# Patient Record
Sex: Male | Born: 1950 | Race: White | Hispanic: No | Marital: Married | Smoking: Never smoker
Health system: Southern US, Community
[De-identification: ages and names within clinical notes are randomized; demographics above are authoritative.]

## PROBLEM LIST (undated history)

## (undated) DIAGNOSIS — I10 Essential (primary) hypertension: Secondary | ICD-10-CM

## (undated) DIAGNOSIS — N200 Calculus of kidney: Secondary | ICD-10-CM

## (undated) DIAGNOSIS — M1990 Unspecified osteoarthritis, unspecified site: Secondary | ICD-10-CM

## (undated) DIAGNOSIS — Z973 Presence of spectacles and contact lenses: Secondary | ICD-10-CM

## (undated) DIAGNOSIS — F419 Anxiety disorder, unspecified: Secondary | ICD-10-CM

## (undated) HISTORY — PX: ROTATOR CUFF REPAIR: SHX139

## (undated) HISTORY — PX: WISDOM TOOTH EXTRACTION: SHX21

## (undated) HISTORY — PX: LITHOTRIPSY: SUR834

---

## 2001-06-26 ENCOUNTER — Encounter: Payer: Self-pay | Admitting: Internal Medicine

## 2001-06-26 ENCOUNTER — Ambulatory Visit (HOSPITAL_COMMUNITY): Admission: RE | Admit: 2001-06-26 | Discharge: 2001-06-26 | Payer: Self-pay | Admitting: Internal Medicine

## 2001-07-13 ENCOUNTER — Ambulatory Visit (HOSPITAL_COMMUNITY): Admission: RE | Admit: 2001-07-13 | Discharge: 2001-07-13 | Payer: Self-pay | Admitting: Orthopedic Surgery

## 2001-07-13 ENCOUNTER — Encounter: Payer: Self-pay | Admitting: Orthopedic Surgery

## 2001-08-20 ENCOUNTER — Ambulatory Visit (HOSPITAL_BASED_OUTPATIENT_CLINIC_OR_DEPARTMENT_OTHER): Admission: RE | Admit: 2001-08-20 | Discharge: 2001-08-20 | Payer: Self-pay | Admitting: Orthopedic Surgery

## 2001-08-22 ENCOUNTER — Encounter (HOSPITAL_COMMUNITY): Admission: RE | Admit: 2001-08-22 | Discharge: 2001-09-21 | Payer: Self-pay | Admitting: Orthopedic Surgery

## 2001-09-25 ENCOUNTER — Encounter (HOSPITAL_COMMUNITY): Admission: RE | Admit: 2001-09-25 | Discharge: 2001-10-25 | Payer: Self-pay | Admitting: *Deleted

## 2001-11-25 ENCOUNTER — Encounter: Payer: Self-pay | Admitting: *Deleted

## 2001-11-25 ENCOUNTER — Ambulatory Visit (HOSPITAL_COMMUNITY): Admission: RE | Admit: 2001-11-25 | Discharge: 2001-11-25 | Payer: Self-pay | Admitting: *Deleted

## 2005-11-14 ENCOUNTER — Emergency Department (HOSPITAL_COMMUNITY): Admission: EM | Admit: 2005-11-14 | Discharge: 2005-11-14 | Payer: Self-pay | Admitting: *Deleted

## 2005-11-16 ENCOUNTER — Ambulatory Visit (HOSPITAL_COMMUNITY): Admission: RE | Admit: 2005-11-16 | Discharge: 2005-11-16 | Payer: Self-pay | Admitting: Orthopaedic Surgery

## 2005-11-21 ENCOUNTER — Emergency Department (HOSPITAL_COMMUNITY): Admission: EM | Admit: 2005-11-21 | Discharge: 2005-11-21 | Payer: Self-pay | Admitting: *Deleted

## 2005-12-02 ENCOUNTER — Ambulatory Visit (HOSPITAL_COMMUNITY): Admission: RE | Admit: 2005-12-02 | Discharge: 2005-12-02 | Payer: Self-pay | Admitting: Orthopaedic Surgery

## 2015-09-08 ENCOUNTER — Other Ambulatory Visit: Payer: Self-pay | Admitting: Orthopaedic Surgery

## 2015-09-11 ENCOUNTER — Encounter (HOSPITAL_COMMUNITY)
Admission: RE | Admit: 2015-09-11 | Discharge: 2015-09-11 | Disposition: A | Discharge status: Home / Self Care | Payer: BLUE CROSS/BLUE SHIELD | Source: Ambulatory Visit | Attending: Orthopaedic Surgery | Admitting: Orthopaedic Surgery

## 2015-09-11 ENCOUNTER — Encounter (HOSPITAL_COMMUNITY): Payer: Self-pay

## 2015-09-11 DIAGNOSIS — Z0183 Encounter for blood typing: Secondary | ICD-10-CM | POA: Diagnosis not present

## 2015-09-11 DIAGNOSIS — M1611 Unilateral primary osteoarthritis, right hip: Secondary | ICD-10-CM | POA: Insufficient documentation

## 2015-09-11 DIAGNOSIS — I10 Essential (primary) hypertension: Secondary | ICD-10-CM | POA: Diagnosis not present

## 2015-09-11 DIAGNOSIS — Z01818 Encounter for other preprocedural examination: Secondary | ICD-10-CM | POA: Diagnosis not present

## 2015-09-11 DIAGNOSIS — Z01812 Encounter for preprocedural laboratory examination: Secondary | ICD-10-CM | POA: Insufficient documentation

## 2015-09-11 DIAGNOSIS — Z79899 Other long term (current) drug therapy: Secondary | ICD-10-CM | POA: Diagnosis not present

## 2015-09-11 HISTORY — DX: Presence of spectacles and contact lenses: Z97.3

## 2015-09-11 HISTORY — DX: Unspecified osteoarthritis, unspecified site: M19.90

## 2015-09-11 HISTORY — DX: Essential (primary) hypertension: I10

## 2015-09-11 HISTORY — DX: Calculus of kidney: N20.0

## 2015-09-11 HISTORY — DX: Anxiety disorder, unspecified: F41.9

## 2015-09-11 LAB — COMPREHENSIVE METABOLIC PANEL
ALT: 47 U/L (ref 17–63)
ANION GAP: 10 (ref 5–15)
AST: 35 U/L (ref 15–41)
Albumin: 4.3 g/dL (ref 3.5–5.0)
Alkaline Phosphatase: 80 U/L (ref 38–126)
BILIRUBIN TOTAL: 1.6 mg/dL — AB (ref 0.3–1.2)
BUN: 17 mg/dL (ref 6–20)
CALCIUM: 9.5 mg/dL (ref 8.9–10.3)
CO2: 26 mmol/L (ref 22–32)
Chloride: 100 mmol/L — ABNORMAL LOW (ref 101–111)
Creatinine, Ser: 0.87 mg/dL (ref 0.61–1.24)
GLUCOSE: 105 mg/dL — AB (ref 65–99)
Potassium: 3.8 mmol/L (ref 3.5–5.1)
Sodium: 136 mmol/L (ref 135–145)
TOTAL PROTEIN: 7.5 g/dL (ref 6.5–8.1)

## 2015-09-11 LAB — CBC WITH DIFFERENTIAL/PLATELET
Basophils Absolute: 0 10*3/uL (ref 0.0–0.1)
Basophils Relative: 0 %
EOS ABS: 0.1 10*3/uL (ref 0.0–0.7)
EOS PCT: 2 %
HCT: 41.1 % (ref 39.0–52.0)
Hemoglobin: 14.4 g/dL (ref 13.0–17.0)
LYMPHS ABS: 2.4 10*3/uL (ref 0.7–4.0)
LYMPHS PCT: 40 %
MCH: 31.7 pg (ref 26.0–34.0)
MCHC: 35 g/dL (ref 30.0–36.0)
MCV: 90.5 fL (ref 78.0–100.0)
MONO ABS: 0.5 10*3/uL (ref 0.1–1.0)
Monocytes Relative: 8 %
Neutro Abs: 3 10*3/uL (ref 1.7–7.7)
Neutrophils Relative %: 50 %
PLATELETS: 225 10*3/uL (ref 150–400)
RBC: 4.54 MIL/uL (ref 4.22–5.81)
RDW: 11.5 % (ref 11.5–15.5)
WBC: 6 10*3/uL (ref 4.0–10.5)

## 2015-09-11 LAB — ABO/RH: ABO/RH(D): B POS

## 2015-09-11 LAB — PROTIME-INR
INR: 1.04 (ref 0.00–1.49)
PROTHROMBIN TIME: 13.8 s (ref 11.6–15.2)

## 2015-09-11 LAB — TYPE AND SCREEN
ABO/RH(D): B POS
Antibody Screen: NEGATIVE

## 2015-09-11 LAB — URINALYSIS, ROUTINE W REFLEX MICROSCOPIC
Bilirubin Urine: NEGATIVE
Glucose, UA: NEGATIVE mg/dL
HGB URINE DIPSTICK: NEGATIVE
Ketones, ur: NEGATIVE mg/dL
Leukocytes, UA: NEGATIVE
Nitrite: NEGATIVE
PH: 6.5 (ref 5.0–8.0)
Protein, ur: NEGATIVE mg/dL
SPECIFIC GRAVITY, URINE: 1.018 (ref 1.005–1.030)

## 2015-09-11 LAB — SEDIMENTATION RATE: Sed Rate: 10 mm/hr (ref 0–16)

## 2015-09-11 LAB — SURGICAL PCR SCREEN
MRSA, PCR: NEGATIVE
STAPHYLOCOCCUS AUREUS: NEGATIVE

## 2015-09-11 LAB — C-REACTIVE PROTEIN: CRP: 1.3 mg/dL — AB (ref <1.0)

## 2015-09-11 LAB — APTT: APTT: 26 s (ref 24–37)

## 2015-09-11 NOTE — Progress Notes (Signed)
Pt denies SOB, chest pain, and being under the care of a cardiologist. Pt denies having an echo and cardiac cath but stated that a stress test was performed >10 years ago. Pt chart forwarded to anesthesia to review abnormal EKG.

## 2015-09-11 NOTE — Pre-Procedure Instructions (Signed)
    KEYONE SCHRAGE  09/11/2015      [id number] - [place], [place] - [address] AT [address] [place] [address] Phone: [phone] Fax: [phone]    Your procedure is scheduled on Thursday, September 17, 2015  Report to [facility] Admitting at 10:15 A.M.  Call this number if you have problems the morning of surgery:  [phone]   Remember:  Do not eat food or drink liquids after midnight Wednesday, September 16, 2015  Take these medicines the morning of surgery with A SIP OF WATER : amLODipine (NORVASC), carvedilol (COREG), sertraline (ZOLOFT)  Stop taking Aspirin, vitamins, fish oil and herbal medications such as Milk thistle. Do not take any NSAIDs ie: Ibuprofen, Advil, Naproxen, BC and Goody Powder or any medication containing Aspirin; stop now.  Do not wear jewelry, make-up or nail polish.  Do not wear lotions, powders, or perfumes.  You may not wear deodorant.  Do not shave 48 hours prior to surgery.  Men may shave face and neck.  Do not bring valuables to the hospital.  [facility] is not responsible for any belongings or valuables.  Contacts, dentures or bridgework may not be worn into surgery.  Leave your suitcase in the car.  After surgery it may be brought to your room.  For patients admitted to the hospital, discharge time will be determined by your treatment team.  Patients discharged the day of surgery will not be allowed to drive home.   Name and phone number of your driver:   Special instructions:   Please read over the following fact sheets that you were given. Pain Booklet, Coughing and Deep Breathing, Blood Transfusion Information, Total Joint Packet, MRSA Information and Surgical Site Infection Prevention

## 2015-09-14 NOTE — Progress Notes (Signed)
Anesthesia Chart Review:  Pt is a 65 year old male scheduled for R total hip arthroplasty anterior approach on 09/17/2015 with Dr. Erlinda Hong.   PMH includes:  HTN. Never smoker. BMI 27  Medications include: amlodipine, carvedilol, losartan-hctz.   Preoperative labs reviewed.    EKG 09/11/15: NSR. Possible LAE. Septal infarct, age undetermined  Reviewed case with Dr. Al Corpus.   If no changes, I anticipate pt can proceed with surgery as scheduled.   Willeen Cass, FNP-BC [facility] Short Stay [facility]/Anesthesiology Phone: [phone] 09/14/2015 4:11 PM

## 2015-09-16 MED ORDER — CEFAZOLIN SODIUM-DEXTROSE 2-3 GM-% IV SOLR
2.0000 g | INTRAVENOUS | Status: AC
  Administered 2015-09-17: 2 g via INTRAVENOUS
  Filled 2015-09-16: qty 50

## 2015-09-16 MED ORDER — BUPIVACAINE LIPOSOME 1.3 % IJ SUSP
20.0000 mL | INTRAMUSCULAR | Status: DC
  Filled 2015-09-16: qty 20

## 2015-09-16 MED ORDER — TRANEXAMIC ACID 1000 MG/10ML IV SOLN
1000.0000 mg | INTRAVENOUS | Status: AC
  Administered 2015-09-17: 1000 mg via INTRAVENOUS
  Filled 2015-09-16: qty 10

## 2015-09-17 ENCOUNTER — Encounter (HOSPITAL_COMMUNITY)
Admission: RE | Disposition: A | Discharge status: Home Health | Payer: Self-pay | Source: Ambulatory Visit | Attending: Orthopaedic Surgery

## 2015-09-17 ENCOUNTER — Inpatient Hospital Stay (HOSPITAL_COMMUNITY)
Admission: RE | Admit: 2015-09-17 | Discharge: 2015-09-19 | DRG: 470 | Disposition: A | Discharge status: Home Health | Payer: BLUE CROSS/BLUE SHIELD | Source: Ambulatory Visit | Attending: Orthopaedic Surgery | Admitting: Orthopaedic Surgery

## 2015-09-17 ENCOUNTER — Inpatient Hospital Stay (HOSPITAL_COMMUNITY): Payer: BLUE CROSS/BLUE SHIELD | Admitting: Emergency Medicine

## 2015-09-17 ENCOUNTER — Inpatient Hospital Stay (HOSPITAL_COMMUNITY): Payer: BLUE CROSS/BLUE SHIELD

## 2015-09-17 ENCOUNTER — Inpatient Hospital Stay (HOSPITAL_COMMUNITY): Payer: BLUE CROSS/BLUE SHIELD | Admitting: Anesthesiology

## 2015-09-17 ENCOUNTER — Encounter (HOSPITAL_COMMUNITY): Payer: Self-pay | Admitting: Surgery

## 2015-09-17 DIAGNOSIS — D62 Acute posthemorrhagic anemia: Secondary | ICD-10-CM | POA: Diagnosis not present

## 2015-09-17 DIAGNOSIS — M1990 Unspecified osteoarthritis, unspecified site: Secondary | ICD-10-CM

## 2015-09-17 DIAGNOSIS — F419 Anxiety disorder, unspecified: Secondary | ICD-10-CM | POA: Diagnosis present

## 2015-09-17 DIAGNOSIS — M1611 Unilateral primary osteoarthritis, right hip: Secondary | ICD-10-CM | POA: Diagnosis present

## 2015-09-17 DIAGNOSIS — Z96649 Presence of unspecified artificial hip joint: Secondary | ICD-10-CM

## 2015-09-17 DIAGNOSIS — I10 Essential (primary) hypertension: Secondary | ICD-10-CM | POA: Diagnosis present

## 2015-09-17 HISTORY — PX: TOTAL HIP ARTHROPLASTY: SHX124

## 2015-09-17 SURGERY — ARTHROPLASTY, HIP, TOTAL, ANTERIOR APPROACH
Anesthesia: Spinal | Site: Hip | Laterality: Right

## 2015-09-17 MED ORDER — CHLORHEXIDINE GLUCONATE 4 % EX LIQD: 60.0000 mL | Freq: Once | CUTANEOUS | Status: DC

## 2015-09-17 MED ORDER — DIPHENHYDRAMINE HCL 12.5 MG/5ML PO ELIX: 25.0000 mg | ORAL_SOLUTION | ORAL | Status: DC | PRN

## 2015-09-17 MED ORDER — BUPIVACAINE HCL (PF) 0.75 % IJ SOLN
INTRAMUSCULAR | Status: DC | PRN
  Administered 2015-09-17: 15 mg via INTRATHECAL

## 2015-09-17 MED ORDER — OXYCODONE HCL 5 MG PO TABS: 5.0000 mg | ORAL_TABLET | ORAL | Status: AC | PRN

## 2015-09-17 MED ORDER — OXYCODONE HCL 5 MG PO TABS
ORAL_TABLET | ORAL | Status: AC
  Filled 2015-09-17: qty 3

## 2015-09-17 MED ORDER — LACTATED RINGERS IV SOLN: INTRAVENOUS | Status: DC

## 2015-09-17 MED ORDER — MORPHINE SULFATE (PF) 2 MG/ML IV SOLN
1.0000 mg | INTRAVENOUS | Status: DC | PRN
Start: 2015-09-17 — End: 2015-09-19

## 2015-09-17 MED ORDER — ACETAMINOPHEN 500 MG PO TABS
1000.0000 mg | ORAL_TABLET | Freq: Four times a day (QID) | ORAL | Status: AC
  Administered 2015-09-18 (×3): 1000 mg via ORAL
  Filled 2015-09-17 (×3): qty 2

## 2015-09-17 MED ORDER — MIDAZOLAM HCL 2 MG/2ML IJ SOLN
INTRAMUSCULAR | Status: AC
  Filled 2015-09-17: qty 2

## 2015-09-17 MED ORDER — DEXAMETHASONE SODIUM PHOSPHATE 10 MG/ML IJ SOLN
10.0000 mg | Freq: Once | INTRAMUSCULAR | Status: AC
  Administered 2015-09-18: 10 mg via INTRAVENOUS
  Filled 2015-09-17: qty 1

## 2015-09-17 MED ORDER — OXYCODONE HCL ER 10 MG PO T12A: 10.0000 mg | EXTENDED_RELEASE_TABLET | Freq: Two times a day (BID) | ORAL | Status: AC

## 2015-09-17 MED ORDER — METHOCARBAMOL 500 MG PO TABS
ORAL_TABLET | ORAL | Status: AC
  Filled 2015-09-17: qty 1

## 2015-09-17 MED ORDER — SODIUM CHLORIDE 0.9 % IR SOLN
Status: DC | PRN
  Administered 2015-09-17: 2000 mL

## 2015-09-17 MED ORDER — POVIDONE-IODINE 10 % EX SOLN
CUTANEOUS | Status: DC | PRN
  Administered 2015-09-17: 1 via TOPICAL

## 2015-09-17 MED ORDER — LOSARTAN POTASSIUM-HCTZ 100-25 MG PO TABS: 1.0000 | ORAL_TABLET | Freq: Every day | ORAL | Status: DC

## 2015-09-17 MED ORDER — ACETAMINOPHEN 325 MG PO TABS: 650.0000 mg | ORAL_TABLET | Freq: Four times a day (QID) | ORAL | Status: DC | PRN

## 2015-09-17 MED ORDER — METOCLOPRAMIDE HCL 5 MG PO TABS: 5.0000 mg | ORAL_TABLET | Freq: Three times a day (TID) | ORAL | Status: DC | PRN

## 2015-09-17 MED ORDER — PROPOFOL 500 MG/50ML IV EMUL
INTRAVENOUS | Status: DC | PRN
  Administered 2015-09-17: 50 ug/kg/min via INTRAVENOUS

## 2015-09-17 MED ORDER — ASPIRIN EC 325 MG PO TBEC: 325.0000 mg | DELAYED_RELEASE_TABLET | Freq: Two times a day (BID) | ORAL | Status: AC

## 2015-09-17 MED ORDER — 0.9 % SODIUM CHLORIDE (POUR BTL) OPTIME
TOPICAL | Status: DC | PRN
  Administered 2015-09-17: 1000 mL

## 2015-09-17 MED ORDER — ASPIRIN EC 325 MG PO TBEC
325.0000 mg | DELAYED_RELEASE_TABLET | Freq: Two times a day (BID) | ORAL | Status: DC
  Administered 2015-09-17 – 2015-09-19 (×4): 325 mg via ORAL
  Filled 2015-09-17 (×4): qty 1

## 2015-09-17 MED ORDER — PHENOL 1.4 % MT LIQD
1.0000 | OROMUCOSAL | Status: DC | PRN
Start: 2015-09-17 — End: 2015-09-19

## 2015-09-17 MED ORDER — TRANEXAMIC ACID 1000 MG/10ML IV SOLN
1000.0000 mg | Freq: Once | INTRAVENOUS | Status: AC
  Administered 2015-09-17: 1000 mg via INTRAVENOUS
  Filled 2015-09-17: qty 10

## 2015-09-17 MED ORDER — LOSARTAN POTASSIUM 50 MG PO TABS
100.0000 mg | ORAL_TABLET | Freq: Every day | ORAL | Status: DC
  Administered 2015-09-17 – 2015-09-19 (×3): 100 mg via ORAL
  Filled 2015-09-17 (×4): qty 2

## 2015-09-17 MED ORDER — MENTHOL 3 MG MT LOZG: 1.0000 | LOZENGE | OROMUCOSAL | Status: DC | PRN

## 2015-09-17 MED ORDER — KETOROLAC TROMETHAMINE 30 MG/ML IJ SOLN
30.0000 mg | Freq: Four times a day (QID) | INTRAMUSCULAR | Status: DC | PRN
  Administered 2015-09-17: 30 mg via INTRAVENOUS

## 2015-09-17 MED ORDER — ONDANSETRON HCL 4 MG PO TABS: 4.0000 mg | ORAL_TABLET | Freq: Three times a day (TID) | ORAL | Status: AC | PRN

## 2015-09-17 MED ORDER — SERTRALINE HCL 50 MG PO TABS
50.0000 mg | ORAL_TABLET | Freq: Every day | ORAL | Status: DC
  Administered 2015-09-18 – 2015-09-19 (×2): 25 mg via ORAL
  Filled 2015-09-17 (×2): qty 1

## 2015-09-17 MED ORDER — LACTATED RINGERS IV SOLN
INTRAVENOUS | Status: DC
  Administered 2015-09-17 (×2): via INTRAVENOUS

## 2015-09-17 MED ORDER — MIDAZOLAM HCL 5 MG/5ML IJ SOLN
INTRAMUSCULAR | Status: DC | PRN
  Administered 2015-09-17 (×3): 1 mg via INTRAVENOUS

## 2015-09-17 MED ORDER — CARVEDILOL 6.25 MG PO TABS
6.2500 mg | ORAL_TABLET | Freq: Two times a day (BID) | ORAL | Status: DC
  Administered 2015-09-17 – 2015-09-19 (×4): 6.25 mg via ORAL
  Filled 2015-09-17 (×4): qty 1

## 2015-09-17 MED ORDER — HYDROCHLOROTHIAZIDE 25 MG PO TABS
25.0000 mg | ORAL_TABLET | Freq: Every day | ORAL | Status: DC
  Administered 2015-09-17 – 2015-09-19 (×3): 25 mg via ORAL
  Filled 2015-09-17 (×3): qty 1

## 2015-09-17 MED ORDER — OXYCODONE HCL 5 MG PO TABS
5.0000 mg | ORAL_TABLET | ORAL | Status: DC | PRN
  Administered 2015-09-17 (×2): 15 mg via ORAL
  Administered 2015-09-18: 10 mg via ORAL
  Administered 2015-09-18 (×2): 15 mg via ORAL
  Administered 2015-09-18: 10 mg via ORAL
  Filled 2015-09-17: qty 3
  Filled 2015-09-17: qty 2
  Filled 2015-09-17 (×3): qty 3
  Filled 2015-09-17: qty 2

## 2015-09-17 MED ORDER — ONDANSETRON HCL 4 MG/2ML IJ SOLN
INTRAMUSCULAR | Status: DC | PRN
  Administered 2015-09-17: 4 mg via INTRAVENOUS

## 2015-09-17 MED ORDER — SODIUM CHLORIDE 0.9 % IV SOLN
INTRAVENOUS | Status: DC
  Administered 2015-09-18: 07:00:00 via INTRAVENOUS

## 2015-09-17 MED ORDER — MAGNESIUM CITRATE PO SOLN: 1.0000 | Freq: Once | ORAL | Status: DC | PRN

## 2015-09-17 MED ORDER — FENTANYL CITRATE (PF) 250 MCG/5ML IJ SOLN
INTRAMUSCULAR | Status: AC
  Filled 2015-09-17: qty 5

## 2015-09-17 MED ORDER — ONDANSETRON HCL 4 MG/2ML IJ SOLN
4.0000 mg | Freq: Four times a day (QID) | INTRAMUSCULAR | Status: DC | PRN
  Administered 2015-09-18: 4 mg via INTRAVENOUS
  Filled 2015-09-17: qty 2

## 2015-09-17 MED ORDER — METHOCARBAMOL 1000 MG/10ML IJ SOLN
500.0000 mg | Freq: Four times a day (QID) | INTRAVENOUS | Status: DC | PRN
  Filled 2015-09-17: qty 5

## 2015-09-17 MED ORDER — SORBITOL 70 % SOLN: 30.0000 mL | Freq: Every day | Status: DC | PRN

## 2015-09-17 MED ORDER — METHOCARBAMOL 750 MG PO TABS: 750.0000 mg | ORAL_TABLET | Freq: Two times a day (BID) | ORAL | Status: DC | PRN

## 2015-09-17 MED ORDER — METOCLOPRAMIDE HCL 5 MG/ML IJ SOLN
5.0000 mg | Freq: Three times a day (TID) | INTRAMUSCULAR | Status: DC | PRN
  Administered 2015-09-18: 10 mg via INTRAVENOUS
  Filled 2015-09-17: qty 2

## 2015-09-17 MED ORDER — ACETAMINOPHEN 650 MG RE SUPP: 650.0000 mg | Freq: Four times a day (QID) | RECTAL | Status: DC | PRN

## 2015-09-17 MED ORDER — AMLODIPINE BESYLATE 5 MG PO TABS
5.0000 mg | ORAL_TABLET | Freq: Every day | ORAL | Status: DC
Start: 2015-09-17 — End: 2015-09-19
  Administered 2015-09-18 – 2015-09-19 (×2): 5 mg via ORAL
  Filled 2015-09-17 (×2): qty 1

## 2015-09-17 MED ORDER — HYDROMORPHONE HCL 1 MG/ML IJ SOLN: 0.2500 mg | INTRAMUSCULAR | Status: DC | PRN

## 2015-09-17 MED ORDER — METHOCARBAMOL 500 MG PO TABS
500.0000 mg | ORAL_TABLET | Freq: Four times a day (QID) | ORAL | Status: DC | PRN
  Administered 2015-09-17: 500 mg via ORAL

## 2015-09-17 MED ORDER — SENNOSIDES-DOCUSATE SODIUM 8.6-50 MG PO TABS: 1.0000 | ORAL_TABLET | Freq: Every evening | ORAL | Status: AC | PRN

## 2015-09-17 MED ORDER — FENTANYL CITRATE (PF) 100 MCG/2ML IJ SOLN
INTRAMUSCULAR | Status: DC | PRN
  Administered 2015-09-17 (×2): 50 ug via INTRAVENOUS

## 2015-09-17 MED ORDER — VITAMIN C 500 MG PO CHEW: 500.0000 mg | CHEWABLE_TABLET | Freq: Two times a day (BID) | ORAL | Status: AC

## 2015-09-17 MED ORDER — KETOROLAC TROMETHAMINE 30 MG/ML IJ SOLN
INTRAMUSCULAR | Status: AC
  Filled 2015-09-17: qty 1

## 2015-09-17 MED ORDER — PROPOFOL 10 MG/ML IV BOLUS
INTRAVENOUS | Status: AC
  Filled 2015-09-17: qty 20

## 2015-09-17 MED ORDER — OXYCODONE HCL ER 10 MG PO T12A
10.0000 mg | EXTENDED_RELEASE_TABLET | Freq: Two times a day (BID) | ORAL | Status: DC
  Administered 2015-09-17 – 2015-09-18 (×3): 10 mg via ORAL
  Filled 2015-09-17 (×4): qty 1

## 2015-09-17 MED ORDER — ALUM & MAG HYDROXIDE-SIMETH 200-200-20 MG/5ML PO SUSP: 30.0000 mL | ORAL | Status: DC | PRN

## 2015-09-17 MED ORDER — ONDANSETRON HCL 4 MG PO TABS: 4.0000 mg | ORAL_TABLET | Freq: Four times a day (QID) | ORAL | Status: DC | PRN

## 2015-09-17 MED ORDER — CEFAZOLIN SODIUM-DEXTROSE 2-3 GM-% IV SOLR
2.0000 g | Freq: Four times a day (QID) | INTRAVENOUS | Status: AC
  Administered 2015-09-17 – 2015-09-18 (×2): 2 g via INTRAVENOUS
  Filled 2015-09-17 (×2): qty 50

## 2015-09-17 MED ORDER — POLYETHYLENE GLYCOL 3350 17 G PO PACK
17.0000 g | PACK | Freq: Every day | ORAL | Status: DC | PRN
Start: 2015-09-17 — End: 2015-09-19

## 2015-09-17 SURGICAL SUPPLY — 59 items
APL SKNCLS STERI-STRIP NONHPOA (GAUZE/BANDAGES/DRESSINGS) ×1
BENZOIN TINCTURE PRP APPL 2/3 (GAUZE/BANDAGES/DRESSINGS) ×1 IMPLANT
BNDG COHESIVE 6X5 TAN STRL LF (GAUZE/BANDAGES/DRESSINGS) ×2 IMPLANT
CAPT HIP TOTAL 2 ×1 IMPLANT
CELLS DAT CNTRL 66122 CELL SVR (MISCELLANEOUS) ×1 IMPLANT
CLSR STERI-STRIP ANTIMIC 1/2X4 (GAUZE/BANDAGES/DRESSINGS) ×1 IMPLANT
COVER SURGICAL LIGHT HANDLE (MISCELLANEOUS) ×2 IMPLANT
DRAPE C-ARM 42X72 X-RAY (DRAPES) ×2 IMPLANT
DRAPE IMP U-DRAPE 54X76 (DRAPES) ×2 IMPLANT
DRAPE STERI IOBAN 125X83 (DRAPES) ×2 IMPLANT
DRAPE U-SHAPE 47X51 STRL (DRAPES) ×6 IMPLANT
DRSG AQUACEL AG ADV 3.5X10 (GAUZE/BANDAGES/DRESSINGS) ×2 IMPLANT
DRSG MEPILEX BORDER 4X8 (GAUZE/BANDAGES/DRESSINGS) IMPLANT
DURAPREP 26ML APPLICATOR (WOUND CARE) ×2 IMPLANT
ELECT BLADE 4.0 EZ CLEAN MEGAD (MISCELLANEOUS) ×2
ELECT REM PT RETURN 9FT ADLT (ELECTROSURGICAL) ×2
ELECTRODE BLDE 4.0 EZ CLN MEGD (MISCELLANEOUS) ×1 IMPLANT
ELECTRODE REM PT RTRN 9FT ADLT (ELECTROSURGICAL) ×1 IMPLANT
FACESHIELD WRAPAROUND (MASK) ×6 IMPLANT
FACESHIELD WRAPAROUND OR TEAM (MASK) ×1 IMPLANT
GLOVE BIO SURGEON STRL SZ 6.5 (GLOVE) ×1 IMPLANT
GLOVE BIOGEL PI IND STRL 7.5 (GLOVE) IMPLANT
GLOVE BIOGEL PI INDICATOR 7.5 (GLOVE) ×1
GLOVE SKINSENSE NS SZ7.5 (GLOVE) ×2
GLOVE SKINSENSE STRL SZ7.5 (GLOVE) ×2 IMPLANT
GLOVE SURG SS PI 7.5 STRL IVOR (GLOVE) ×2 IMPLANT
GLOVE SURG SYN 7.5  E (GLOVE) ×4
GLOVE SURG SYN 7.5 E (GLOVE) ×4 IMPLANT
GLOVE SURG SYN 7.5 PF PI (GLOVE) ×1 IMPLANT
GOWN SRG XL XLNG 56XLVL 4 (GOWN DISPOSABLE) ×1 IMPLANT
GOWN STRL NON-REIN XL XLG LVL4 (GOWN DISPOSABLE) ×2
GOWN STRL REUS W/ TWL LRG LVL3 (GOWN DISPOSABLE) IMPLANT
GOWN STRL REUS W/TWL LRG LVL3 (GOWN DISPOSABLE) ×4
HANDPIECE INTERPULSE COAX TIP (DISPOSABLE) ×2
KIT BASIN OR (CUSTOM PROCEDURE TRAY) ×2 IMPLANT
MARKER SKIN DUAL TIP RULER LAB (MISCELLANEOUS) ×2 IMPLANT
PACK TOTAL JOINT (CUSTOM PROCEDURE TRAY) ×2 IMPLANT
PACK UNIVERSAL I (CUSTOM PROCEDURE TRAY) ×2 IMPLANT
PADDING CAST COTTON 6X4 STRL (CAST SUPPLIES) ×1 IMPLANT
RETRACTOR WND ALEXIS 18 MED (MISCELLANEOUS) ×1 IMPLANT
RTRCTR WOUND ALEXIS 18CM MED (MISCELLANEOUS) ×2
SAW OSC TIP CART 19.5X105X1.3 (SAW) ×2 IMPLANT
SEALER BIPOLAR AQUA 6.0 (INSTRUMENTS) ×2 IMPLANT
SET HNDPC FAN SPRY TIP SCT (DISPOSABLE) ×1 IMPLANT
SOLUTION BETADINE 4OZ (MISCELLANEOUS) ×3 IMPLANT
SUT ETHIBOND 2 V 37 (SUTURE) ×2 IMPLANT
SUT ETHIBOND NAB CT1 #1 30IN (SUTURE) ×6 IMPLANT
SUT ETHILON 3 0 FSL (SUTURE) ×2 IMPLANT
SUT MNCRL AB 4-0 PS2 18 (SUTURE) ×1 IMPLANT
SUT VIC AB 0 CT1 27 (SUTURE) ×2
SUT VIC AB 0 CT1 27XBRD ANBCTR (SUTURE) ×1 IMPLANT
SUT VIC AB 1 CT1 27 (SUTURE) ×2
SUT VIC AB 1 CT1 27XBRD ANBCTR (SUTURE) ×1 IMPLANT
SUT VIC AB 2-0 CT1 27 (SUTURE) ×4
SUT VIC AB 2-0 CT1 TAPERPNT 27 (SUTURE) ×2 IMPLANT
SYR 20CC LL (SYRINGE) ×2 IMPLANT
SYR CONTROL 10ML LL (SYRINGE) ×1 IMPLANT
TOWEL OR 17X26 10 PK STRL BLUE (TOWEL DISPOSABLE) ×2 IMPLANT
TRAY FOLEY CATH 16FR SILVER (SET/KITS/TRAYS/PACK) ×2 IMPLANT

## 2015-09-17 NOTE — H&P (Signed)
    PREOPERATIVE H&P  Chief Complaint: Right hip osteoarthritis  HPI: Thomas Hart is a 65 y.o. male who presents for surgical treatment of Right hip osteoarthritis.  He denies any changes in medical history.  Past Medical History  Diagnosis Date  . Hypertension   . Anxiety   . Osteoarthritis     right hip  . Wears glasses   . Kidney stone    Past Surgical History  Procedure Laterality Date  . Lithotripsy    . Wisdom tooth extraction     Social History   Social History  . Marital Status: Married    Spouse Name: N/A  . Number of Children: N/A  . Years of Education: N/A   Social History Main Topics  . Smoking status: Never Smoker   . Smokeless tobacco: Never Used  . Alcohol Use: Yes     Comment: 1-2 cans of beer daily  . Drug Use: No  . Sexual Activity: Not on file   Other Topics Concern  . Not on file   Social History Narrative  . No narrative on file   Family History  Problem Relation Age of Onset  . Heart attack Father    Allergies  Allergen Reactions  . Tetanus Toxoids     High fever, aches   Prior to Admission medications   Medication Sig Start Date End Date Taking? Authorizing Provider  amLODipine (NORVASC) 5 MG tablet Take 5 mg by mouth daily. 08/20/15  Yes Historical Provider, MD  carvedilol (COREG) 6.25 MG tablet Take 6.25 mg by mouth 2 (two) times daily. 07/28/15  Yes Historical Provider, MD  ibuprofen (ADVIL,MOTRIN) 400 MG tablet Take 400 mg by mouth every 6 (six) hours as needed for fever, headache or moderate pain.   Yes Historical Provider, MD  losartan-hydrochlorothiazide (HYZAAR) 100-25 MG tablet Take 1 tablet by mouth daily. 07/28/15  Yes Historical Provider, MD  milk thistle 175 MG tablet Take 175 mg by mouth daily.   Yes Historical Provider, MD  sertraline (ZOLOFT) 100 MG tablet Take 50 mg by mouth daily. 07/28/15  Yes Historical Provider, MD     Positive ROS: All other systems have been reviewed and were otherwise negative with the  exception of those mentioned in the HPI and as above.  Physical Exam: General: Alert, no acute distress Cardiovascular: No pedal edema Respiratory: No cyanosis, no use of accessory musculature GI: abdomen soft Skin: No lesions in the area of chief complaint Neurologic: Sensation intact distally Psychiatric: Patient is competent for consent with normal mood and affect Lymphatic: no lymphedema  MUSCULOSKELETAL: exam stable  Assessment: Right hip osteoarthritis  Plan: Plan for Procedure(s): RIGHT TOTAL HIP ARTHROPLASTY ANTERIOR APPROACH  The risks benefits and alternatives were discussed with the patient including but not limited to the risks of nonoperative treatment, versus surgical intervention including infection, bleeding, nerve injury,  blood clots, cardiopulmonary complications, morbidity, mortality, among others, and they were willing to proceed.   Marianna Payment, MD   09/17/2015 7:52 AM

## 2015-09-17 NOTE — Anesthesia Procedure Notes (Signed)
Spinal Patient location during procedure: OR Start time: 09/17/2015 1:53 PM End time: 09/17/2015 1:58 PM Staffing Anesthesiologist: Rod Mae Performed by: anesthesiologist  Preanesthetic Checklist Completed: patient identified, site marked, surgical consent, pre-op evaluation, timeout performed, IV checked, risks and benefits discussed and monitors and equipment checked Spinal Block Patient position: sitting Prep: Betadine Patient monitoring: heart rate, continuous pulse ox and blood pressure Approach: midline Location: L3-4 Injection technique: single-shot Needle Needle type: Pencan  Needle gauge: 24 G Needle length: 9 cm Assessment Sensory level: T6 Additional Notes Expiration date of kit checked and confirmed. Patient tolerated procedure well, without complications.

## 2015-09-17 NOTE — Op Note (Signed)
RIGHT TOTAL HIP ARTHROPLASTY ANTERIOR APPROACH  Procedure Note Thomas Hart   [id number]  Pre-op Diagnosis: Right hip osteoarthritis     Post-op Diagnosis: same   Operative Procedures  1. Total hip replacement; Right hip; uncemented [id number]   Personnel  Surgeon(s): Leandrew Koyanagi, MD   Anesthesia: spinal  Prosthesis: Depuy Acetabulum: Pinnacle 54 mm Femur: Corail KA 13 Head: 36 size: +1.5 Liner: neutral Bearing Type: Ceramic on poly  Date of Service: 09/17/2015  Total Hip Arthroplasty (Anterior Approach) Op Note:  After informed consent was obtained and the operative extremity marked in the holding area, the patient was brought back to the operating room and placed supine on the HANA table. Next, the operative extremity was prepped and draped in normal sterile fashion. Surgical timeout occurred verifying patient identification, surgical site, surgical procedure and administration of antibiotics.  A modified anterior Shelden-Peterson approach to the hip was performed, using the interval between tensor fascia lata and sartorius.  Dissection was carried bluntly down onto the anterior hip capsule. The lateral femoral circumflex vessels were identified and coagulated. A capsulotomy was performed and the capsular flaps tagged for later repair.  Fluoroscopy was utilized to prepare for the femoral neck cut. The neck osteotomy was performed. The femoral head was removed, the acetabular rim was cleared of soft tissue and attention was turned to reaming the acetabulum.  Sequential reaming was performed under fluoroscopic guidance. We reamed to a size 53 mm, and then impacted the acetabular shell. The liner was then placed after irrigation and attention turned to the femur.  After placing the femoral hook, the leg was taken to externally rotated, extended and adducted position taking care to perform soft tissue releases to allow for adequate mobilization of the femur. Soft tissue was cleared from  the shoulder of the greater trochanter and the hook elevator used to improve exposure of the proximal femur. Sequential broaching performed up to a size 13. Trial neck and head were placed. The leg was brought back up to neutral and the construct reduced. The position and sizing of components, offset and leg lengths were checked using fluoroscopy. Stability of the  construct was checked in extension and external rotation without any subluxation or impingement of prosthesis. We dislocated the prosthesis, dropped the leg back into position, removed trial components, and irrigated copiously. The final stem and head was then placed, the leg brought back up, the system reduced and fluoroscopy used to verify positioning.  We irrigated, obtained hemostasis and closed the capsule using #2 ethibond suture.  Dilute betadyne solution was used. The fascia was closed with #1 vicryl plus, the deep fat layer was closed with 0 vicryl, the subcutaneous layers closed with 2.0 Vicryl Plus and the skin closed with 4.0 monocryl and steri strips. A sterile dressing was applied. The patient was awakened in the operating room and taken to recovery in stable condition.  All sponge, needle, and instrument counts were correct at the end of the case.   Position: supine  Complications: none.  Time Out: performed   Drains/Packing: none  Estimated blood loss: 200 cc  Returned to Recovery Room: in good condition.   Antibiotics: yes   Mechanical VTE (DVT) Prophylaxis: sequential compression devices, TED thigh-high  Chemical VTE (DVT) Prophylaxis: aspirin   Fluid Replacement: see anesthesia record  Specimens Removed: 1 to pathology   Sponge and Instrument Count Correct? yes   PACU: portable radiograph - low AP   Admission: inpatient status, start PT & OT  POD#1  Plan/RTC: Return in 2 weeks for staple removal. Return in 6 weeks to see MD.  Weight Bearing/Load Lower Extremity: full  Hip precautions: none Suture Removal:  10-14 days  Betadine to incision twice daily once dressing is removed on POD#7  N. Eduard Roux, MD [place] 3:46 PM      Implant Name Type Inv. Item Serial No. Manufacturer Lot No. LRB No. Used  LINER NEUTRAL 36ID 54OD - [id number] Liner LINER NEUTRAL 36ID 54OD  DEPUY [id number] Right 1  ACETABULAR CUP Daisy Blossom 54MM - [id number] Plate ACETABULAR CUP W GRIPTION 54MM  DEPUY [id number] Right 1  SCREW 6.5MMX25MM - [id number] Screw SCREW 6.5MMX25MM  DEPUY [id number] Right 1  HEAD CERAMIC DELTA 36 PLUS 1.5 - [id number] Hips HEAD CERAMIC DELTA 36 PLUS 1.5  DEPUY [id number] Right 1  STEM Ronalee Belts - [id number] Stem STEM Ronalee Belts   DEPUY [id number] Right 1

## 2015-09-17 NOTE — Transfer of Care (Signed)
Immediate Anesthesia Transfer of Care Note  Patient: Thomas Hart  Procedure(s) Performed: Procedure(s): RIGHT TOTAL HIP ARTHROPLASTY ANTERIOR APPROACH (Right)  Patient Location: PACU  Anesthesia Type:Spinal  Level of Consciousness: awake, alert , oriented and patient cooperative  Airway & Oxygen Therapy: Patient Spontanous Breathing and Patient connected to nasal cannula oxygen  Post-op Assessment: Report given to RN and Post -op Vital signs reviewed and stable  Post vital signs: Reviewed and stable  Last Vitals:  Filed Vitals:   09/17/15 1015 09/17/15 1610  BP: 158/94 136/79  Pulse: 71 62  Temp: 36.7 C   Resp: 16 15    Complications: No apparent anesthesia complications

## 2015-09-17 NOTE — Discharge Instructions (Signed)

## 2015-09-17 NOTE — Anesthesia Preprocedure Evaluation (Addendum)
Anesthesia Evaluation  Patient identified by MRN, date of birth, ID band Patient awake    Reviewed: Allergy & Precautions, H&P , NPO status , Patient's Chart, lab work & pertinent test results, reviewed documented beta blocker date and time   Airway Mallampati: II  TM Distance: >3 FB Neck ROM: full    Dental no notable dental hx. (+) Dental Advisory Given, Teeth Intact   Pulmonary neg pulmonary ROS,    Pulmonary exam normal breath sounds clear to auscultation       Cardiovascular Exercise Tolerance: Good hypertension, Pt. on home beta blockers and Pt. on medications Normal cardiovascular exam Rhythm:regular Rate:Normal     Neuro/Psych negative neurological ROS  negative psych ROS   GI/Hepatic negative GI ROS, Neg liver ROS,   Endo/Other  negative endocrine ROS  Renal/GU negative Renal ROS  negative genitourinary   Musculoskeletal   Abdominal   Peds  Hematology negative hematology ROS (+)   Anesthesia Other Findings   Reproductive/Obstetrics negative OB ROS                            Anesthesia Physical Anesthesia Plan  ASA: II  Anesthesia Plan: Spinal   Post-op Pain Management:    Induction:   Airway Management Planned:   Additional Equipment:   Intra-op Plan:   Post-operative Plan:   Informed Consent: I have reviewed the patients History and Physical, chart, labs and discussed the procedure including the risks, benefits and alternatives for the proposed anesthesia with the patient or authorized representative who has indicated his/her understanding and acceptance.   Dental Advisory Given  Plan Discussed with: CRNA and Surgeon  Anesthesia Plan Comments:         Anesthesia Quick Evaluation

## 2015-09-18 ENCOUNTER — Encounter (HOSPITAL_COMMUNITY): Payer: Self-pay | Admitting: Orthopaedic Surgery

## 2015-09-18 LAB — BASIC METABOLIC PANEL
Anion gap: 10 (ref 5–15)
BUN: 14 mg/dL (ref 6–20)
CHLORIDE: 96 mmol/L — AB (ref 101–111)
CO2: 29 mmol/L (ref 22–32)
Calcium: 8.7 mg/dL — ABNORMAL LOW (ref 8.9–10.3)
Creatinine, Ser: 0.94 mg/dL (ref 0.61–1.24)
GFR calc non Af Amer: >60 mL/min (ref >60)
Glucose, Bld: 104 mg/dL — ABNORMAL HIGH (ref 65–99)
POTASSIUM: 4 mmol/L (ref 3.5–5.1)
Sodium: 135 mmol/L (ref 135–145)

## 2015-09-18 LAB — CBC
HCT: 33.6 % — ABNORMAL LOW (ref 39.0–52.0)
HEMOGLOBIN: 11.6 g/dL — AB (ref 13.0–17.0)
MCH: 31.9 pg (ref 26.0–34.0)
MCHC: 34.5 g/dL (ref 30.0–36.0)
MCV: 92.3 fL (ref 78.0–100.0)
Platelets: 191 10*3/uL (ref 150–400)
RBC: 3.64 MIL/uL — AB (ref 4.22–5.81)
RDW: 11.4 % — ABNORMAL LOW (ref 11.5–15.5)
WBC: 7.6 10*3/uL (ref 4.0–10.5)

## 2015-09-18 NOTE — Progress Notes (Signed)
   Subjective:  Patient reports pain as mild.  No events.  Objective:   VITALS:   Filed Vitals:   09/17/15 1900 09/17/15 2059 09/18/15 0100 09/18/15 0500  BP: 151/72 120/76 118/74 103/65  Pulse: 70 70 70 74  Temp: 97.9 F (36.6 C) 98.3 F (36.8 C) 98.1 F (36.7 C) 97.7 F (36.5 C)  TempSrc: Oral Oral Oral Oral  Resp: 16 16 16 16   Height:      Weight:      SpO2: 96% 92% 93% 94%    Neurologically intact Neurovascular intact Sensation intact distally Intact pulses distally Dorsiflexion/Plantar flexion intact Incision: dressing C/D/I and no drainage No cellulitis present Compartment soft   Lab Results  Component Value Date   WBC 6.0 09/11/2015   HGB 14.4 09/11/2015   HCT 41.1 09/11/2015   MCV 90.5 09/11/2015   PLT 225 09/11/2015     Assessment/Plan:  1 Day Post-Op   - Expected postop acute blood loss anemia - will monitor for symptoms - Up with PT/OT - DVT ppx - SCDs, ambulation, aspirin - WBAT operative extremity - Pain control - Discharge planning - likely home sat  Marianna Payment 09/18/2015, 7:07 AM [phone]

## 2015-09-18 NOTE — Progress Notes (Signed)
Physical Therapy Treatment Patient Details Name: Thomas Hart MRN: [id number] DOB: July 30, 1950 Today's Date: 09/18/2015    History of Present Illness 65 yo male s/p R THA anterior approach    PT Comments    Patient progressing very well with mobility, ambulated in hall and performed stair negotiation without difficulty.  Patient educated on technique for car transfers. Patient also with good carry over and performance of therapeutic exercise program (hand out provided). Anticipate patient will be ready for d/c from PT stand point after am session next day.    Follow Up Recommendations  Home health PT;Supervision for mobility/OOB     Equipment Recommendations  Rolling walker with 5" wheels;3in1 (PT)    Recommendations for Other Services       Precautions / Restrictions Precautions Precautions: Fall Restrictions Weight Bearing Restrictions: Yes RLE Weight Bearing: Weight bearing as tolerated    Mobility  Bed Mobility Overal bed mobility: Needs Assistance Bed Mobility: Supine to Sit     Supine to sit: Min guard     General bed mobility comments: Min guard for safety. Verbal cues for safe hand placement and positioning. No physical assist required or reports of dizziness.  Transfers Overall transfer level: Needs assistance Equipment used: Rolling walker (2 wheeled) Transfers: Sit to/from Stand Sit to Stand: Min guard         General transfer comment: Min guard for safety and balance. Verbal cues for safe hand placement and proper use of RW. No physical assist required, no LOB or reports of dizziness.  Ambulation/Gait Ambulation/Gait assistance: Supervision Ambulation Distance (Feet): 260 Feet Assistive device: Rolling walker (2 wheeled) Gait Pattern/deviations: Step-through pattern;Decreased stride length;Antalgic     General Gait Details: improved stability and cadence this pm   Stairs Stairs: Yes Stairs assistance: Supervision Stair Management: No  rails;Two rails Number of Stairs: 5 ((2 sets of 5 with bilateral rails, 4 steps without and rail)) General stair comments: patient was able to perform the steps with use of bilateral rails and cues for sequencing at supervision level, min guard for stair negotiation without use of rails. Increased time without UE support.  Wheelchair Mobility    Modified Rankin (Stroke Patients Only)       Balance Overall balance assessment: Needs assistance Sitting-balance support: No upper extremity supported;Feet supported Sitting balance-Leahy Scale: Good     Standing balance support: Bilateral upper extremity supported;During functional activity Standing balance-Leahy Scale: Fair                      Cognition Arousal/Alertness: Awake/alert Behavior During Therapy: WFL for tasks assessed/performed Overall Cognitive Status: Within Functional Limits for tasks assessed                      Exercises      General Comments General comments (skin integrity, edema, etc.): good recall of home therapeutic exercise program. Educated on mobility expectations for discharge, decreased dependence on assistive device, and simulated car transfers with patient.       Pertinent Vitals/Pain Pain Assessment: 0-10 Pain Score: 2  (5 with movement) Pain Location: R hip Pain Descriptors / Indicators: Aching;Sore Pain Intervention(s): Limited activity within patient's tolerance;Monitored during session;Repositioned    Home Living Family/patient expects to be discharged to:: Private residence Living Arrangements: Spouse/significant other Available Help at Discharge: Family;Available 24 hours/day Type of Home: House Home Access: Stairs to enter Entrance Stairs-Rails: Right;Left;Can reach both Home Layout: Two level;Able to live on main level with bedroom/bathroom Home  Equipment: Walker - standard;Cane - single point      Prior Function Level of Independence: Independent          PT  Goals (current goals can now be found in the care plan section) Acute Rehab PT Goals Patient Stated Goal: to go home PT Goal Formulation: With patient Time For Goal Achievement: 10/02/15 Potential to Achieve Goals: Good Progress towards PT goals: Progressing toward goals    Frequency  7X/week    PT Plan Current plan remains appropriate    Co-evaluation             End of Session Equipment Utilized During Treatment: Gait belt Activity Tolerance: Patient tolerated treatment well;Patient limited by pain Patient left: in chair;with call bell/phone within reach     Time: 1625-1650 PT Time Calculation (min) (ACUTE ONLY): 25 min  Charges:  $Gait Training: 8-22 mins $Self Care/Home Management: 8-22                    G CodesDuncan Dull Oct 01, 2015, 5:13 PM [name], [name] DPT  [phone]

## 2015-09-18 NOTE — Care Management (Signed)
Utilization review completed. Sunjai Levandoski, RN Case Manager [phone]. 

## 2015-09-18 NOTE — Care Management Note (Signed)
Case Management Note  Patient Details  Name: IRENEO SICKEL MRN: [id number] Date of Birth: August 06, 1950  Subjective/Objective:  65 yr old male s/p right total hip arthroplasty.                  Action/Plan: Case manager spoke with patient at the bedside concerning home health and DME needs at discharge. Choice was offered for Home Health agency. Referral was called to [facility], [place] will have family support at discharge.    Expected Discharge Date:    09/19/15              Expected Discharge Plan:  [place]  In-House Referral:     Discharge planning Services  CM Consult  Post Acute Care Choice:  Durable Medical Equipment, Home Health Choice offered to:     DME Arranged:  3-N-1, Walker rolling DME Agency:  [place]:  PT [place]:  [place]  Status of Service:  Completed, signed off  Medicare Important Message Given:    Date Medicare IM Given:    Medicare IM give by:    Date Additional Medicare IM Given:    Additional Medicare Important Message give by:     If discussed at [place] of Stay Meetings, dates discussed:    Additional Comments:  Ninfa Meeker, RN 09/18/2015, 3:14 PM

## 2015-09-18 NOTE — Progress Notes (Signed)
Occupational Therapy Evaluation Patient Details Name: Thomas Hart MRN: [id number] DOB: [birth year] Today's Date: 09/18/2015    History of Present Illness 65 yo male s/p R THA anterior approach   Clinical Impression   PTA, pt was independent with ADLs and mobility. Pt currently requires min assist for LB ADLs and min guard assist for functional transfers. Began education on compensatory strategies for ADLs, proper use of DME, energy conservation, and fall prevention strategies. Pt plans to d/c home with 24/7 assistance from his wife. Pt will benefit from continued acute OT to increase independence and safety with ADLs and mobility to allow for safe discharge home. No OT follow up recommended - recommending 3in1 for home use.    Follow Up Recommendations  No OT follow up;Supervision/Assistance - 24 hour    Equipment Recommendations  3 in 1 bedside comode    Recommendations for Other Services       Precautions / Restrictions Precautions Precautions: Fall Restrictions Weight Bearing Restrictions: Yes RLE Weight Bearing: Weight bearing as tolerated      Mobility Bed Mobility Overal bed mobility: Needs Assistance Bed Mobility: Supine to Sit     Supine to sit: Min guard     General bed mobility comments: Min guard for safety. Verbal cues for safe hand placement and positioning. No physical assist required or reports of dizziness.  Transfers Overall transfer level: Needs assistance Equipment used: Rolling walker (2 wheeled) Transfers: Sit to/from Stand Sit to Stand: Min guard         General transfer comment: Min guard for safety and balance. Verbal cues for safe hand placement and proper use of RW. No physical assist required, no LOB or reports of dizziness.    Balance Overall balance assessment: Needs assistance Sitting-balance support: No upper extremity supported;Feet supported Sitting balance-Leahy Scale: Good     Standing balance support: Bilateral upper  extremity supported;During functional activity Standing balance-Leahy Scale: Fair                              ADL Overall ADL's : Needs assistance/impaired     Grooming: Wash/dry hands;Oral care;Min guard;Standing           Upper Body Dressing : Set up;Sitting   Lower Body Dressing: Minimal assistance;Sit to/from stand   Toilet Transfer: Min guard;Ambulation;BSC;RW;Cueing for safety Toilet Transfer Details (indicate cue type and reason): BSC over toilet, cues to feel BSC on back of legs before reaching back to sit Toileting- Clothing Manipulation and Hygiene: Min guard;Sit to/from stand       Functional mobility during ADLs: Min guard;Rolling walker General ADL Comments: Began education on compensatory strategies for ADLs, proper use of DME for transfers, and energy conservation.      Vision Vision Assessment?: No apparent visual deficits   Perception     Praxis      Pertinent Vitals/Pain Pain Assessment: 0-10 Pain Score: 2  (5 with movement) Pain Location: R hip Pain Descriptors / Indicators: Aching;Sore Pain Intervention(s): Limited activity within patient's tolerance;Monitored during session;Repositioned     Hand Dominance Right   Extremity/Trunk Assessment Upper Extremity Assessment Upper Extremity Assessment: Overall WFL for tasks assessed   Lower Extremity Assessment Lower Extremity Assessment: RLE deficits/detail RLE Deficits / Details: decreased ROM and strength as expected post op RLE: Unable to fully assess due to pain   Cervical / Trunk Assessment Cervical / Trunk Assessment: Normal   Communication Communication Communication: No difficulties   Cognition  Arousal/Alertness: Awake/alert Behavior During Therapy: WFL for tasks assessed/performed Overall Cognitive Status: Within Functional Limits for tasks assessed                     General Comments       Exercises       Shoulder Instructions      Home Living  Family/patient expects to be discharged to:: Private residence Living Arrangements: Spouse/significant other Available Help at Discharge: Family;Available 24 hours/day Type of Home: House Home Access: Stairs to enter [employer] of Steps: 2 Entrance Stairs-Rails: Right;Left;Can reach both Home Layout: Two level;Able to live on main level with bedroom/bathroom Alternate Level Stairs-Number of Steps: flight   Bathroom Shower/Tub: Walk-in shower;Door   [employer] Toilet: Standard     Home Equipment: Walker - standard;Cane - single point          Prior Functioning/Environment Level of Independence: Independent             OT Diagnosis: Acute pain   OT Problem List: Decreased strength;Decreased activity tolerance;Decreased range of motion;Impaired balance (sitting and/or standing);Decreased coordination;Decreased safety awareness;Decreased knowledge of use of DME or AE;Decreased knowledge of precautions;Pain   OT Treatment/Interventions: Self-care/ADL training;Therapeutic exercise;DME and/or AE instruction;Energy conservation;Therapeutic activities;Patient/family education;Balance training    OT Goals(Current goals can be found in the care plan section) Acute Rehab OT Goals Patient Stated Goal: to go home OT Goal Formulation: With patient Time For Goal Achievement: 10/02/15 Potential to Achieve Goals: Good ADL Goals Pt Will Perform Lower Body Dressing: with min assist;with caregiver independent in assisting;sit to/from stand Pt Will Perform Tub/Shower Transfer: Shower transfer;ambulating;3 in 1;rolling walker;with caregiver independent in assisting  OT Frequency: Min 2X/week   Barriers to D/C:            Co-evaluation              End of Session Equipment Utilized During Treatment: Gait belt;Rolling walker  Activity Tolerance: Patient tolerated treatment well Patient left: Other (comment) (with PT)   Time: [id number] OT Time Calculation (min): 17  min Charges:  OT General Charges $OT Visit: 1 Procedure OT Evaluation $OT Eval Moderate Complexity: 1 Procedure G-Codes:    Redmond Baseman, OTR/L [phone] 09/18/2015, 4:31 PM

## 2015-09-18 NOTE — Evaluation (Signed)
Physical Therapy Evaluation Patient Details Name: Thomas Hart MRN: [id number] DOB: Jun 19, 1951 Today's Date: 09/18/2015   History of Present Illness  65 yo male s/p R THA anterior approach  Clinical Impression  Patient demonstrates deficits in functional mobility as indicated below. Will need continued skilled PT to address deficits and maximize function. Will see as indicated and progress as tolerated. Patient mobilizing well and tolerated education re: therapeutic exercise.    Follow Up Recommendations Home health PT;Supervision for mobility/OOB    Equipment Recommendations  Rolling walker with 5" wheels;3in1 (PT)    Recommendations for Other Services       Precautions / Restrictions Precautions Precautions: Fall Restrictions Weight Bearing Restrictions: Yes RLE Weight Bearing: Weight bearing as tolerated      Mobility  Bed Mobility Overal bed mobility: Needs Assistance Bed Mobility: Supine to Sit     Supine to sit: Min guard     General bed mobility comments: Vcs for positioning and self assist, no physical assist required, increased time to perform.  Transfers Overall transfer level: Needs assistance Equipment used: Rolling walker (2 wheeled) Transfers: Sit to/from Stand Sit to Stand: Min guard         General transfer comment: VCs for hand placement and safety with use of RW. Min guard during elevation to upright.  Ambulation/Gait Ambulation/Gait assistance: Min guard Ambulation Distance (Feet): 210 Feet Assistive device: Rolling walker (2 wheeled) Gait Pattern/deviations: Step-through pattern;Decreased stride length;Antalgic     General Gait Details: patient steady with gait using RW. Cues for step through cadence and increased speed  Stairs            Wheelchair Mobility    Modified Rankin (Stroke Patients Only)       Balance Overall balance assessment: No apparent balance deficits (not formally assessed)                                           Pertinent Vitals/Pain Pain Assessment: 0-10 Pain Score: 2  (5 with movement) Pain Location: hip Pain Descriptors / Indicators: Aching;Guarding;Sore Pain Intervention(s): Limited activity within patient's tolerance;Monitored during session;Repositioned;Premedicated before session    [place] expects to be discharged to:: Private residence Living Arrangements: Spouse/significant other Available Help at Discharge: Family Type of Home: House Home Access: Stairs to enter   [employer] of Steps: 2 Home Layout: Two level;Able to live on main level with bedroom/bathroom Home Equipment: Walker - standard;Cane - single point      Prior Function                 Hand Dominance        Extremity/Trunk Assessment   Upper Extremity Assessment: Overall WFL for tasks assessed           Lower Extremity Assessment: RLE deficits/detail RLE Deficits / Details: functional strength intact       Communication      Cognition Arousal/Alertness: Awake/alert Behavior During Therapy: WFL for tasks assessed/performed                        General Comments General comments (skin integrity, edema, etc.): educated on activity expectations, pain management, and ther ex.    Exercises General Exercises - Lower Extremity Ankle Circles/Pumps: AROM;Both;20 reps Quad Sets: AROM;Right;5 reps (5 secon hold) Gluteal Sets: 5 reps (5 second hold) Hip ABduction/ADduction: 5 reps;AROM;Right (difficulty performing)  Assessment/Plan    PT Assessment Patient needs continued PT services  PT Diagnosis Difficulty walking;Abnormality of gait;Acute pain   PT Problem List Decreased strength;Decreased range of motion;Decreased activity tolerance;Decreased balance;Decreased mobility;Decreased knowledge of use of DME;Pain  PT Treatment Interventions DME instruction;Gait training;Stair training;Functional mobility  training;Therapeutic activities;Therapeutic exercise;Balance training;Patient/family education   PT Goals (Current goals can be found in the Care Plan section) Acute Rehab PT Goals Patient Stated Goal: to go home PT Goal Formulation: With patient Time For Goal Achievement: 10/02/15 Potential to Achieve Goals: Good    Frequency 7X/week   Barriers to discharge        Co-evaluation PT/OT/SLP Co-Evaluation/Treatment:  ([place]ove tailed session with OT)             End of Session Equipment Utilized During Treatment: Gait belt Activity Tolerance: Patient tolerated treatment well;Patient limited by pain Patient left: in chair;with call bell/phone within reach Nurse Communication: Mobility status         Time: [id number] PT Time Calculation (min) (ACUTE ONLY): 13 min   Charges:   PT Evaluation $PT Eval Moderate Complexity: 1 Procedure     PT G CodesDuncan Dull Oct 04, 2015, 1:33 PM [name], [place] DPT  [phone]

## 2015-09-19 NOTE — Anesthesia Postprocedure Evaluation (Signed)
Anesthesia Post Note  Patient: Thomas Hart  Procedure(s) Performed: Procedure(s) (LRB): RIGHT TOTAL HIP ARTHROPLASTY ANTERIOR APPROACH (Right)  Patient location during evaluation: PACU Anesthesia Type: Spinal Level of consciousness: awake and alert Pain management: pain level controlled Vital Signs Assessment: post-procedure vital signs reviewed and stable Respiratory status: spontaneous breathing, nonlabored ventilation, respiratory function stable and patient connected to nasal cannula oxygen Cardiovascular status: blood pressure returned to baseline and stable Postop Assessment: no signs of nausea or vomiting Anesthetic complications: no    Last Vitals:  Filed Vitals:   09/18/15 1522 09/18/15 2200  BP: 123/45 123/74  Pulse: 61 67  Temp: 36.9 C 36.7 C  Resp: 17 16    Last Pain:  Filed Vitals:   09/18/15 2303  PainSc: Asleep                 Anneth Brunell L

## 2015-09-19 NOTE — Progress Notes (Signed)
Physical Therapy Treatment Patient Details Name: Thomas Hart MRN: [id number] DOB: 1950-08-24 Today's Date: 09/19/2015    History of Present Illness 65 yo male s/p R THA anterior approach    PT Comments    Patient is making good progress with PT.  From a mobility standpoint anticipate patient will be ready for DC home medically ready.     Follow Up Recommendations  Home health PT;Supervision for mobility/OOB     Equipment Recommendations  Rolling walker with 5" wheels;3in1 (PT)    Recommendations for Other Services       Precautions / Restrictions Precautions Precautions: Fall Restrictions Weight Bearing Restrictions: Yes RLE Weight Bearing: Weight bearing as tolerated    Mobility  Bed Mobility               General bed mobility comments: ambulaing with RW in room upon arrival  Transfers Overall transfer level: Needs assistance Equipment used: Rolling walker (2 wheeled) Transfers: Sit to/from Stand Sit to Stand: Modified independent (Device/Increase time) Stand pivot transfers: Modified independent (Device/Increase time)       General transfer comment: carry over of safe hand placement  Ambulation/Gait Ambulation/Gait assistance: Supervision Ambulation Distance (Feet): 250 Feet Assistive device: Rolling walker (2 wheeled) Gait Pattern/deviations: Step-through pattern;Decreased stride length     General Gait Details: cues for position in RW with ability to correct; pt with steady gait    Stairs Stairs: Yes Stairs assistance: Supervision Stair Management: Two rails;Forwards Number of Stairs: 10 (5 X 2) General stair comments: cues for sequencing when descending steps; pt with carry over of technique and with no unsteadiness noted  Wheelchair Mobility    Modified Rankin (Stroke Patients Only)       Balance Overall balance assessment: Needs assistance Sitting-balance support: No upper extremity supported;Feet supported Sitting balance-Leahy  Scale: Good     Standing balance support: No upper extremity supported Standing balance-Leahy Scale: Fair                      Cognition Arousal/Alertness: Awake/alert Behavior During Therapy: WFL for tasks assessed/performed Overall Cognitive Status: Within Functional Limits for tasks assessed                      Exercises Total Joint Exercises Hip ABduction/ADduction: AROM;Right;15 reps;Standing Knee Flexion: AROM;Both;15 reps;Standing Marching in Standing: AROM;Both;15 reps;Standing    General Comments General comments (skin integrity, edema, etc.): educated pt on use of ice and HEP      Pertinent Vitals/Pain Pain Assessment: 0-10 Pain Score: 0-No pain Pain Descriptors / Indicators: Tightness Pain Intervention(s): Monitored during session;Ice applied    Home Living                      Prior Function            PT Goals (current goals can now be found in the care plan section) Acute Rehab PT Goals Patient Stated Goal: get back to work PT Goal Formulation: With patient Time For Goal Achievement: 10/02/15 Potential to Achieve Goals: Good Progress towards PT goals: Progressing toward goals    Frequency  7X/week    PT Plan Current plan remains appropriate    Co-evaluation             End of Session Equipment Utilized During Treatment: Gait belt Activity Tolerance: Patient tolerated treatment well Patient left: in chair;with call bell/phone within reach     Time: [id number] PT Time Calculation (min) (ACUTE  ONLY): 19 min  Charges:  $Gait Training: 8-22 mins                    G Codes:      [place] April, PTA Pager: [phone]   09/19/2015, 9:06 AM

## 2015-09-19 NOTE — Progress Notes (Signed)
Subjective: 2 Days Post-Op Procedure(s) (LRB): RIGHT TOTAL HIP ARTHROPLASTY ANTERIOR APPROACH (Right) Patient reports pain as mild.    Objective: Vital signs in last 24 hours: Temp:  [97.8 F (36.6 C)-98.5 F (36.9 C)] 97.8 F (36.6 C) (03/04 0500) Pulse Rate:  [61-69] 69 (03/04 0500) Resp:  [16-17] 16 (03/04 0500) BP: (102-123)/(45-76) 102/59 mmHg (03/04 0500) SpO2:  [92 %-97 %] 96 % (03/04 0500)  Intake/Output from previous day: 03/03 0701 - 03/04 0700 In: 480 [P.O.:480] Out: 1300 [Urine:1300] Intake/Output this shift:     Recent Labs  09/18/15 0641  HGB 11.6*    Recent Labs  09/18/15 0641  WBC 7.6  RBC 3.64*  HCT 33.6*  PLT 191    Recent Labs  09/18/15 0641  NA 135  K 4.0  CL 96*  CO2 29  BUN 14  CREATININE 0.94  GLUCOSE 104*  CALCIUM 8.7*   No results for input(s): LABPT, INR in the last 72 hours.  Neurologically intact  Assessment/Plan: 2 Days Post-Op Procedure(s) (LRB): RIGHT TOTAL HIP ARTHROPLASTY ANTERIOR APPROACH (Right) Up with therapy d/c home. Rx on chart.  YATES,MARK C 09/19/2015, 8:34 AM

## 2015-09-19 NOTE — Progress Notes (Signed)
Patient and wife provided with discharge instructions and prescriptions.  IV removed.  Patient escorted via wheelchair on discharge.  No complaints at the time of discharge.

## 2015-09-19 NOTE — Progress Notes (Signed)
Occupational Therapy Treatment Patient Details Name: Thomas Hart MRN: [id number] DOB: May 22, 1951 Today's Date: 09/19/2015    History of present illness 65 yo male s/p R THA anterior approach   OT comments  Shower stall transfer completed at S level.  Pt. Reports no further questions or concerns in preparation for d/c home.  Moving well and will have 24/7 A from wife as needed.  D/c expected later today.    Follow Up Recommendations  No OT follow up;Supervision/Assistance - 24 hour    Equipment Recommendations  3 in 1 bedside comode    Recommendations for Other Services      Precautions / Restrictions Precautions Precautions: Fall Restrictions RLE Weight Bearing: Weight bearing as tolerated       Mobility Bed Mobility               General bed mobility comments: up in recliner upon arrival into room  Transfers Overall transfer level: Needs assistance Equipment used: Rolling walker (2 wheeled) Transfers: Sit to/from [employer] Transfers Sit to Stand: Modified independent (Device/Increase time) Stand pivot transfers: Modified independent (Device/Increase time)            Balance                                   ADL Overall ADL's : Needs assistance/impaired                       Lower Body Dressing Details (indicate cue type and reason): declined need for A/E states wife available to assist as needed 24/7   Toilet Transfer Details (indicate cue type and reason): declined need for review, states he has been traveling to/from b.room without assist     Tub/ Shower Transfer: Walk-in shower;Supervision/safety;Anterior/posterior;Rolling walker;Grab bars Tub/Shower Transfer Details (indicate cue type and reason): pt. has a shower stall with small lede and grab bar Functional mobility during ADLs: Supervision/safety General ADL Comments: progressing well, no safety concerns.  able to complete return demo of shower stall transfer with  S.  d/c expected later today      Vision                     Perception     Praxis      Cognition   Behavior During Therapy: [name] for tasks assessed/performed Overall Cognitive Status: Within Functional Limits for tasks assessed                       Extremity/Trunk Assessment               Exercises     Shoulder Instructions       General Comments      Pertinent Vitals/ Pain       Pain Assessment: 0-10 Pain Score: 0-No pain  Home Living                                          Prior Functioning/Environment              Frequency Min 2X/week     Progress Toward Goals  OT Goals(current goals can now be found in the care plan section)  Progress towards OT goals: Progressing toward goals     Plan Discharge plan remains appropriate  Co-evaluation                 End of Session Equipment Utilized During Treatment: Gait belt;Rolling walker   Activity Tolerance Patient tolerated treatment well   Patient Left in chair;with call bell/phone within reach   Nurse Communication          Time: [id number] OT Time Calculation (min): 11 min  Charges: OT General Charges $OT Visit: 1 Procedure OT Treatments $Self Care/Home Management : 8-22 mins  Janice Coffin, COTA/L 09/19/2015, 7:59 AM

## 2015-09-22 NOTE — Discharge Summary (Signed)
Physician Discharge Summary      Patient ID: Thomas Hart MRN: [id number] DOB/AGE: [birth year] 65 y.o.  Admit date: 09/17/2015 Discharge date: 09/22/2015  Admission Diagnoses:  <principal problem not specified>  Discharge Diagnoses:  Active Problems:   Hip joint replacement status   Past Medical History  Diagnosis Date  . Hypertension   . Anxiety   . Osteoarthritis     right hip  . Wears glasses   . Kidney stone     Surgeries: Procedure(s): RIGHT TOTAL HIP ARTHROPLASTY ANTERIOR APPROACH on 09/17/2015   Consultants (if any):    Discharged Condition: Improved  Hospital Course: BRAYLENN MOHAMMADI is an 65 y.o. male who was admitted 09/17/2015 with a diagnosis of <principal problem not specified> and went to the operating room on 09/17/2015 and underwent the above named procedures.    He was given perioperative antibiotics:  Anti-infectives    Start     Dose/Rate Route Frequency Ordered Stop   09/17/15 2000  ceFAZolin (ANCEF) IVPB 2 g/50 mL premix     2 g 100 mL/hr over 30 Minutes Intravenous Every 6 hours 09/17/15 1754 09/18/15 0232   09/17/15 0600  ceFAZolin (ANCEF) IVPB 2 g/50 mL premix     2 g 100 mL/hr over 30 Minutes Intravenous To [facility] 09/16/15 1113 09/17/15 1425    .  He was given sequential compression devices, early ambulation, and aspirin for DVT prophylaxis.  He benefited maximally from the hospital stay and there were no complications.    Recent vital signs:  Filed Vitals:   09/19/15 0500 09/19/15 0922  BP: 102/59 117/69  Pulse: 69   Temp: 97.8 F (36.6 C)   Resp: 16     Recent laboratory studies:  Lab Results  Component Value Date   HGB 11.6* 09/18/2015   HGB 14.4 09/11/2015   Lab Results  Component Value Date   WBC 7.6 09/18/2015   PLT 191 09/18/2015   Lab Results  Component Value Date   INR 1.04 09/11/2015   Lab Results  Component Value Date   NA 135 09/18/2015   K 4.0 09/18/2015   CL 96* 09/18/2015   CO2 29 09/18/2015     BUN 14 09/18/2015   CREATININE 0.94 09/18/2015   GLUCOSE 104* 09/18/2015    Discharge Medications:     Medication List    TAKE these medications        amLODipine 5 MG tablet  Commonly known as:  NORVASC  Take 5 mg by mouth daily.     aspirin EC 325 MG tablet  Take 1 tablet (325 mg total) by mouth 2 (two) times daily.     carvedilol 6.25 MG tablet  Commonly known as:  COREG  Take 6.25 mg by mouth 2 (two) times daily.     ibuprofen 400 MG tablet  Commonly known as:  ADVIL,MOTRIN  Take 400 mg by mouth every 6 (six) hours as needed for fever, headache or moderate pain.     losartan-hydrochlorothiazide 100-25 MG tablet  Commonly known as:  HYZAAR  Take 1 tablet by mouth daily.     methocarbamol 750 MG tablet  Commonly known as:  ROBAXIN  Take 1 tablet (750 mg total) by mouth 2 (two) times daily as needed for muscle spasms.     milk thistle 175 MG tablet  Take 175 mg by mouth daily.     ondansetron 4 MG tablet  Commonly known as:  ZOFRAN  Take 1-2 tablets (4-8 mg total)  by mouth every 8 (eight) hours as needed for nausea or vomiting.     oxyCODONE 10 mg 12 hr tablet  Commonly known as:  OXYCONTIN  Take 1 tablet (10 mg total) by mouth every 12 (twelve) hours.     oxyCODONE 5 MG immediate release tablet  Commonly known as:  Oxy IR/ROXICODONE  Take 1-3 tablets (5-15 mg total) by mouth every 4 (four) hours as needed.     senna-docusate 8.6-50 MG tablet  Commonly known as:  SENOKOT S  Take 1 tablet by mouth at bedtime as needed.     sertraline 100 MG tablet  Commonly known as:  ZOLOFT  Take 50 mg by mouth daily.     Vitamin C 500 MG Chew  Chew 1 tablet (500 mg total) by mouth 2 (two) times daily.        Diagnostic Studies: Dg Pelvis Portable  09/17/2015  CLINICAL DATA:  Status post right total hip arthroplasty. EXAM: PORTABLE PELVIS 1-2 VIEWS COMPARISON:  None. FINDINGS: Right total hip arthroplasty identified without complicating features. Degenerative changes  in the left hip are noted. There is no evidence of acute fracture or dislocation. IMPRESSION: Right total hip arthroplasty without complicating features. Electronically Signed   By: Margarette Canada M.D.   On: 09/17/2015 17:51   Dg Hip Operative Unilat With Pelvis Right  09/17/2015  CLINICAL DATA:  Surgery: Right AA Hip Fluoro Time: 18 Sec EXAM: OPERATIVE RIGHT HIP (WITH PELVIS IF PERFORMED) 2 VIEWS TECHNIQUE: Fluoroscopic spot image(s) were submitted for interpretation post-operatively. COMPARISON:  11/14/2005 FINDINGS: Frontal views are performed showing right hip arthroplasty. No evidence for acute fracture or dislocation. IMPRESSION: Right hip arthroplasty.  No adverse features identified. Electronically Signed   By: Nolon Nations M.D.   On: 09/17/2015 15:51    Disposition: 06-Home-Health Care Svc        Follow-up Information    Follow up with Marianna Payment, MD In 2 weeks.   Specialty:  Orthopedic Surgery   Why:  For suture removal, For wound re-check   Contact information:   [place] [address] [phone]       Follow up with [facility].   Why:  Someone from [facility] will contact you concerning start date and time for therapy.   Contact information:   [address] [place] [address] [phone]       Follow up with Marianna Payment, MD In 2 weeks.   Specialty:  Orthopedic Surgery   Contact information:   [place] [address] [phone]        Signed: Marianna Payment 09/22/2015, 8:27 AM

## 2015-12-08 ENCOUNTER — Encounter (INDEPENDENT_AMBULATORY_CARE_PROVIDER_SITE_OTHER): Payer: Self-pay | Admitting: *Deleted

## 2015-12-16 ENCOUNTER — Encounter (INDEPENDENT_AMBULATORY_CARE_PROVIDER_SITE_OTHER): Payer: Self-pay | Admitting: *Deleted

## 2015-12-18 ENCOUNTER — Encounter (INDEPENDENT_AMBULATORY_CARE_PROVIDER_SITE_OTHER): Payer: Self-pay | Admitting: *Deleted

## 2016-03-05 ENCOUNTER — Encounter (INDEPENDENT_AMBULATORY_CARE_PROVIDER_SITE_OTHER): Payer: Self-pay

## 2016-06-21 ENCOUNTER — Ambulatory Visit (INDEPENDENT_AMBULATORY_CARE_PROVIDER_SITE_OTHER): Payer: BLUE CROSS/BLUE SHIELD | Admitting: Orthopaedic Surgery

## 2016-06-21 ENCOUNTER — Ambulatory Visit (INDEPENDENT_AMBULATORY_CARE_PROVIDER_SITE_OTHER): Payer: BLUE CROSS/BLUE SHIELD

## 2016-06-21 ENCOUNTER — Encounter (INDEPENDENT_AMBULATORY_CARE_PROVIDER_SITE_OTHER): Payer: Self-pay | Admitting: Orthopaedic Surgery

## 2016-06-21 DIAGNOSIS — M1611 Unilateral primary osteoarthritis, right hip: Secondary | ICD-10-CM

## 2016-06-21 NOTE — Progress Notes (Signed)
   Office Visit Note   Patient: Thomas Hart           Date of Birth: Dec 07, 1950           MRN: [id number] Visit Date: 06/21/2016              Requested by: Asencion Noble, MD [address] [place], [place] [address] PCP: Asencion Noble, MD   Assessment & Plan: Visit Diagnoses:  1. Primary osteoarthritis of right hip     Plan: Doing very well.  Reminded him of dental prophylaxis.  F/u 1 year with repeat pelvis xrays  Follow-Up Instructions: Return in about 1 year (around 06/21/2017) for recheck right THA.   Orders:  Orders Placed This Encounter  Procedures  . XR Pelvis 1-2 Views   No orders of the defined types were placed in this encounter.     Procedures: No procedures performed   Clinical Data: No additional findings.   Subjective: Chief Complaint  Patient presents with  . Left Hip - Pain    9 months s/p right THA.  Doing very well.  Happy with progress.  Hiking 5 miles.      Review of Systems   Objective: Vital Signs: There were no vitals taken for this visit.  Physical Exam  Ortho Exam Hip exam is normal.  No pain.  Full ROM Specialty Comments:  No specialty comments available.  Imaging: No results found.   PMFS History: Patient Active Problem List   Diagnosis Date Noted  . Primary osteoarthritis of right hip 06/21/2016  . Hip joint replacement status 09/17/2015   Past Medical History:  Diagnosis Date  . Anxiety   . Hypertension   . Kidney stone   . Osteoarthritis    right hip  . Wears glasses     Family History  Problem Relation Age of Onset  . Heart attack Father     Past Surgical History:  Procedure Laterality Date  . LITHOTRIPSY    . ROTATOR CUFF REPAIR    . TOTAL HIP ARTHROPLASTY Right 09/17/2015   Procedure: RIGHT TOTAL HIP ARTHROPLASTY ANTERIOR APPROACH;  Surgeon: Leandrew Koyanagi, MD;  Location: [facility];  Service: Orthopedics;  Laterality: Right;  . WISDOM TOOTH EXTRACTION     Social History   Occupational History  . Not  on file.   Social History Main Topics  . Smoking status: Never Smoker  . Smokeless tobacco: Never Used  . Alcohol use Yes     Comment: 1-2 cans of beer daily  . Drug use: No  . Sexual activity: Not on file

## 2017-03-27 IMAGING — CR DG PELVIS PORTABLE
1 series · 1 of 1 positions shown · non-contrast
Comparison: None.

CLINICAL DATA: Status post right total hip arthroplasty.

EXAM:
PORTABLE PELVIS 1-2 VIEWS

[AP]
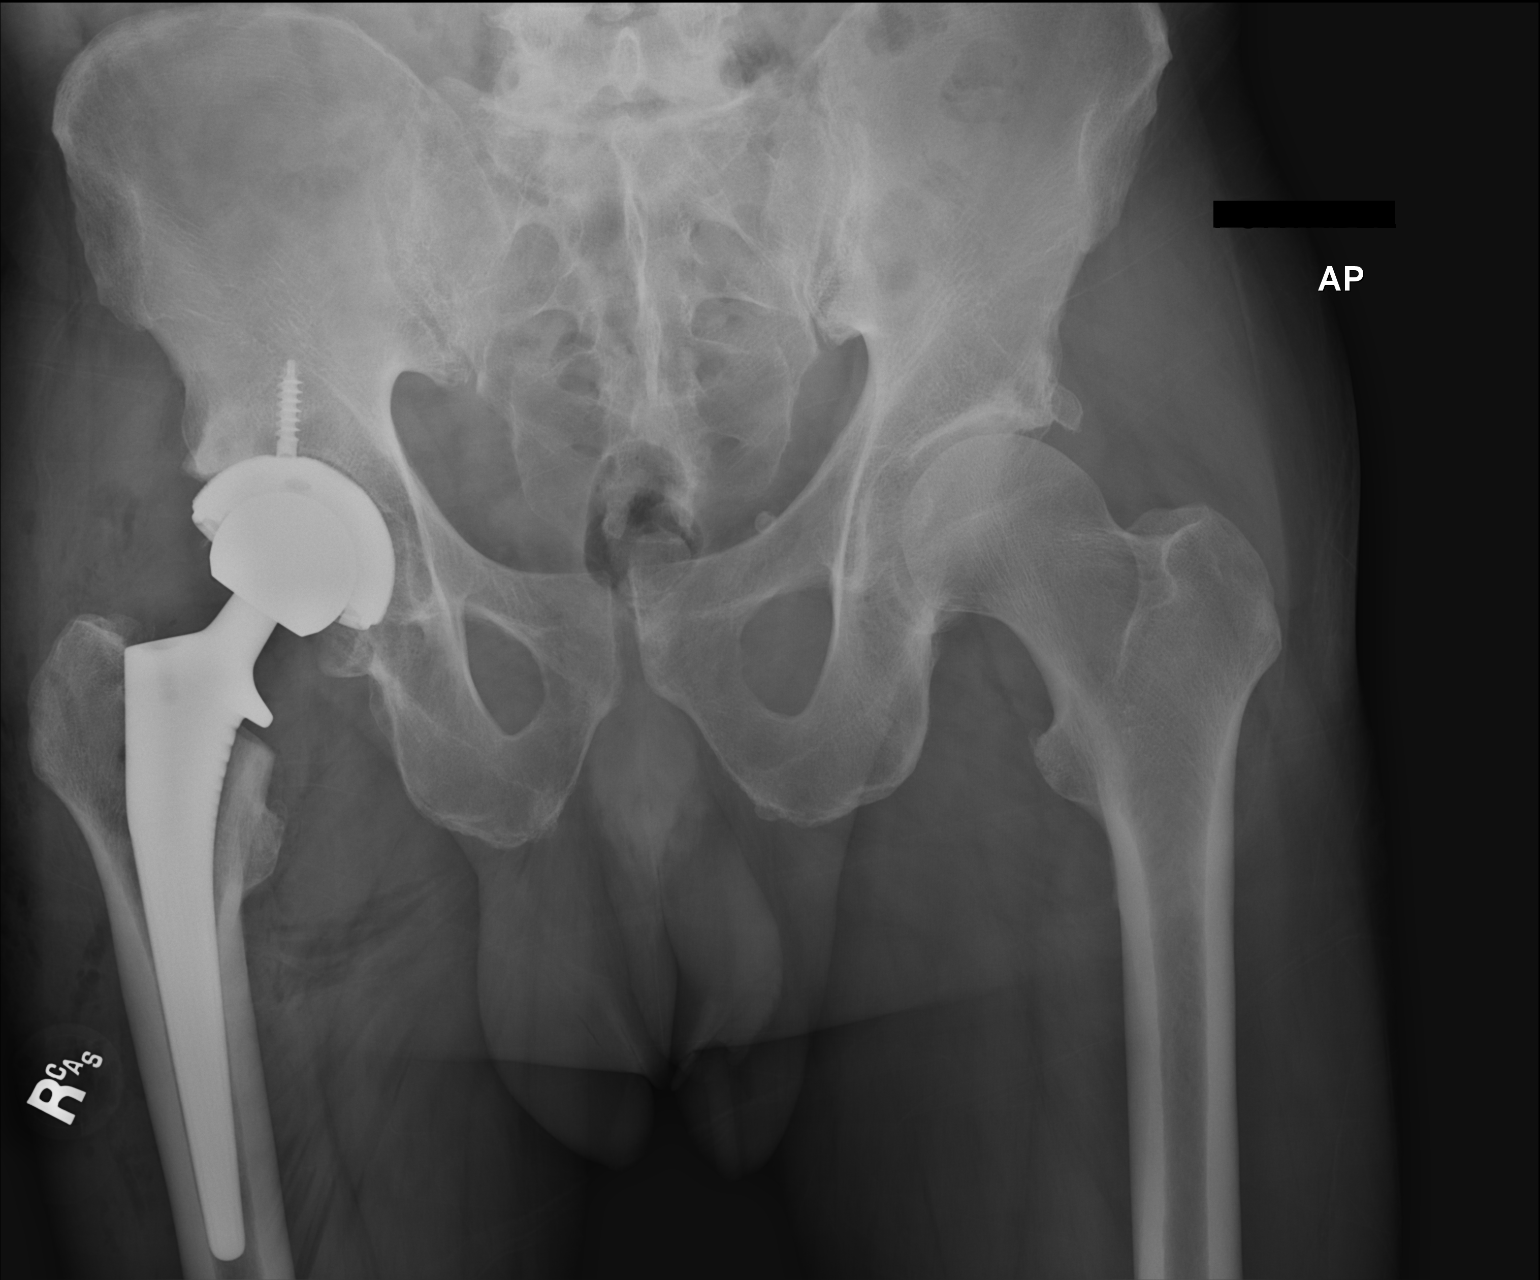

[1 of 1 positions shown; findings below may reference images not displayed]

FINDINGS: Right total hip arthroplasty identified without complicating
features.

Degenerative changes in the left hip are noted.

There is no evidence of acute fracture or dislocation.
IMPRESSION: Right total hip arthroplasty without complicating features.

## 2017-05-18 DIAGNOSIS — Z23 Encounter for immunization: Secondary | ICD-10-CM | POA: Diagnosis not present

## 2017-06-22 ENCOUNTER — Ambulatory Visit (INDEPENDENT_AMBULATORY_CARE_PROVIDER_SITE_OTHER): Payer: BLUE CROSS/BLUE SHIELD | Admitting: Orthopaedic Surgery

## 2017-08-03 DIAGNOSIS — Z23 Encounter for immunization: Secondary | ICD-10-CM | POA: Diagnosis not present

## 2017-08-03 DIAGNOSIS — Z6825 Body mass index (BMI) 25.0-25.9, adult: Secondary | ICD-10-CM | POA: Diagnosis not present

## 2017-08-03 DIAGNOSIS — F419 Anxiety disorder, unspecified: Secondary | ICD-10-CM | POA: Diagnosis not present

## 2017-08-03 DIAGNOSIS — I10 Essential (primary) hypertension: Secondary | ICD-10-CM | POA: Diagnosis not present

## 2017-12-26 DIAGNOSIS — H35039 Hypertensive retinopathy, unspecified eye: Secondary | ICD-10-CM | POA: Diagnosis not present

## 2018-01-01 DIAGNOSIS — D485 Neoplasm of uncertain behavior of skin: Secondary | ICD-10-CM | POA: Diagnosis not present

## 2018-01-01 DIAGNOSIS — Z85828 Personal history of other malignant neoplasm of skin: Secondary | ICD-10-CM | POA: Diagnosis not present

## 2018-01-01 DIAGNOSIS — D225 Melanocytic nevi of trunk: Secondary | ICD-10-CM | POA: Diagnosis not present

## 2018-01-01 DIAGNOSIS — L570 Actinic keratosis: Secondary | ICD-10-CM | POA: Diagnosis not present

## 2018-01-01 DIAGNOSIS — X32XXXD Exposure to sunlight, subsequent encounter: Secondary | ICD-10-CM | POA: Diagnosis not present

## 2018-01-01 DIAGNOSIS — Z1283 Encounter for screening for malignant neoplasm of skin: Secondary | ICD-10-CM | POA: Diagnosis not present

## 2018-01-01 DIAGNOSIS — Z08 Encounter for follow-up examination after completed treatment for malignant neoplasm: Secondary | ICD-10-CM | POA: Diagnosis not present

## 2018-01-01 DIAGNOSIS — L820 Inflamed seborrheic keratosis: Secondary | ICD-10-CM | POA: Diagnosis not present

## 2018-02-05 DIAGNOSIS — X32XXXA Exposure to sunlight, initial encounter: Secondary | ICD-10-CM | POA: Diagnosis not present

## 2018-02-05 DIAGNOSIS — L570 Actinic keratosis: Secondary | ICD-10-CM | POA: Diagnosis not present

## 2018-02-05 DIAGNOSIS — D485 Neoplasm of uncertain behavior of skin: Secondary | ICD-10-CM | POA: Diagnosis not present

## 2018-02-05 DIAGNOSIS — D2271 Melanocytic nevi of right lower limb, including hip: Secondary | ICD-10-CM | POA: Diagnosis not present

## 2018-03-06 DIAGNOSIS — I10 Essential (primary) hypertension: Secondary | ICD-10-CM | POA: Diagnosis not present

## 2018-03-06 DIAGNOSIS — E785 Hyperlipidemia, unspecified: Secondary | ICD-10-CM | POA: Diagnosis not present

## 2018-03-06 DIAGNOSIS — Z125 Encounter for screening for malignant neoplasm of prostate: Secondary | ICD-10-CM | POA: Diagnosis not present

## 2018-03-06 DIAGNOSIS — Z79899 Other long term (current) drug therapy: Secondary | ICD-10-CM | POA: Diagnosis not present

## 2018-03-06 DIAGNOSIS — F419 Anxiety disorder, unspecified: Secondary | ICD-10-CM | POA: Diagnosis not present

## 2018-03-15 DIAGNOSIS — I10 Essential (primary) hypertension: Secondary | ICD-10-CM | POA: Diagnosis not present

## 2018-08-06 DIAGNOSIS — X32XXXD Exposure to sunlight, subsequent encounter: Secondary | ICD-10-CM | POA: Diagnosis not present

## 2018-08-06 DIAGNOSIS — L821 Other seborrheic keratosis: Secondary | ICD-10-CM | POA: Diagnosis not present

## 2018-08-06 DIAGNOSIS — D225 Melanocytic nevi of trunk: Secondary | ICD-10-CM | POA: Diagnosis not present

## 2018-08-06 DIAGNOSIS — L570 Actinic keratosis: Secondary | ICD-10-CM | POA: Diagnosis not present

## 2018-08-06 DIAGNOSIS — C44311 Basal cell carcinoma of skin of nose: Secondary | ICD-10-CM | POA: Diagnosis not present

## 2018-08-06 DIAGNOSIS — L820 Inflamed seborrheic keratosis: Secondary | ICD-10-CM | POA: Diagnosis not present

## 2018-08-06 DIAGNOSIS — Z23 Encounter for immunization: Secondary | ICD-10-CM | POA: Diagnosis not present

## 2018-08-07 DIAGNOSIS — H43811 Vitreous degeneration, right eye: Secondary | ICD-10-CM | POA: Diagnosis not present

## 2018-08-14 DIAGNOSIS — H524 Presbyopia: Secondary | ICD-10-CM | POA: Diagnosis not present

## 2018-08-14 DIAGNOSIS — H353131 Nonexudative age-related macular degeneration, bilateral, early dry stage: Secondary | ICD-10-CM | POA: Diagnosis not present

## 2018-08-14 DIAGNOSIS — H43813 Vitreous degeneration, bilateral: Secondary | ICD-10-CM | POA: Diagnosis not present

## 2018-08-23 ENCOUNTER — Ambulatory Visit (INDEPENDENT_AMBULATORY_CARE_PROVIDER_SITE_OTHER): Payer: Medicare Other | Admitting: Orthopaedic Surgery

## 2018-08-23 ENCOUNTER — Ambulatory Visit (INDEPENDENT_AMBULATORY_CARE_PROVIDER_SITE_OTHER): Payer: Medicare Other

## 2018-08-23 ENCOUNTER — Encounter (INDEPENDENT_AMBULATORY_CARE_PROVIDER_SITE_OTHER): Payer: Self-pay | Admitting: Orthopaedic Surgery

## 2018-08-23 DIAGNOSIS — M25551 Pain in right hip: Secondary | ICD-10-CM | POA: Diagnosis not present

## 2018-08-23 NOTE — Progress Notes (Signed)
Office Visit Note   Patient: Thomas Hart           Date of Birth: 30-Sep-1950           MRN: [id number] Visit Date: 08/23/2018              Requested by: Asencion Noble, MD [address] [place], [place] [address] PCP: Asencion Noble, MD   Assessment & Plan: Visit Diagnoses:  1. Pain in right hip     Plan: Impression is right hip flexor tendinitis and tightness.  Recommend physical therapy home exercises.  Recommend over-the-counter NSAIDs as needed.  Stretches were demonstrated today.  Questions encouraged and answered.  Follow-up in 2 years with AP pelvis and lateral hip.  Follow-Up Instructions: Return in about 2 years (around 08/23/2020).   Orders:  Orders Placed This Encounter  Procedures  . XR HIP UNILAT W OR W/O PELVIS 2-3 VIEWS RIGHT   No orders of the defined types were placed in this encounter.     Procedures: No procedures performed   Clinical Data: No additional findings.   Subjective: Chief Complaint  Patient presents with  . Right Hip - Pain    Thomas Hart is 3 years status post right total hip replacement.  He comes in with 3 months of right hip soreness.  He denies any activity out of the ordinary.  Denies any injuries.  He denies any bursa pain and denies any groin pain.  He states that he only feels a discomfort when he flexes his hip.  He denies any thigh pain or any start up pain.   Review of Systems  Constitutional: Negative.   All other systems reviewed and are negative.    Objective: Vital Signs: There were no vitals taken for this visit.  Physical Exam Vitals signs and nursing note reviewed.  Constitutional:      Appearance: He is well-developed.  Pulmonary:     Effort: Pulmonary effort is normal.  Abdominal:     Palpations: Abdomen is soft.  Skin:    General: Skin is warm.  Neurological:     Mental Status: He is alert and oriented to person, place, and time.  Psychiatric:        Behavior: Behavior normal.        Thought Content:  Thought content normal.        Judgment: Judgment normal.     Ortho Exam Right hip exam shows a fully healed surgical scar.  Trochanteric bursa is nontender.  He has painless range of motion of his hip.  He has reproducible discomfort in his ATFL and hip flexors with resisted hip flexion. Specialty Comments:  No specialty comments available.  Imaging: Xr Hip Unilat W Or W/o Pelvis 2-3 Views Right  Result Date: 08/23/2018 Stable right total hip replacement in good alignment.    PMFS History: Patient Active Problem List   Diagnosis Date Noted  . Primary osteoarthritis of right hip 06/21/2016  . Hip joint replacement status 09/17/2015   Past Medical History:  Diagnosis Date  . Anxiety   . Hypertension   . Kidney stone   . Osteoarthritis    right hip  . Wears glasses     Family History  Problem Relation Age of Onset  . Heart attack Father     Past Surgical History:  Procedure Laterality Date  . LITHOTRIPSY    . ROTATOR CUFF REPAIR    . TOTAL HIP ARTHROPLASTY Right 09/17/2015   Procedure: RIGHT TOTAL HIP  ARTHROPLASTY ANTERIOR APPROACH;  Surgeon: Leandrew Koyanagi, MD;  Location: [facility];  Service: Orthopedics;  Laterality: Right;  . WISDOM TOOTH EXTRACTION     Social History   Occupational History  . Not on file  Tobacco Use  . Smoking status: Never Smoker  . Smokeless tobacco: Never Used  Substance and Sexual Activity  . Alcohol use: Yes    Comment: 1-2 cans of beer daily  . Drug use: No  . Sexual activity: Not on file

## 2019-03-19 DIAGNOSIS — R6889 Other general symptoms and signs: Secondary | ICD-10-CM | POA: Diagnosis not present

## 2019-03-21 ENCOUNTER — Other Ambulatory Visit: Payer: Self-pay

## 2019-03-21 DIAGNOSIS — Z20822 Contact with and (suspected) exposure to covid-19: Secondary | ICD-10-CM

## 2019-03-22 LAB — NOVEL CORONAVIRUS, NAA: SARS-CoV-2, NAA: NOT DETECTED

## 2019-05-27 DIAGNOSIS — Z23 Encounter for immunization: Secondary | ICD-10-CM | POA: Diagnosis not present

## 2019-07-24 DIAGNOSIS — S46011A Strain of muscle(s) and tendon(s) of the rotator cuff of right shoulder, initial encounter: Secondary | ICD-10-CM | POA: Diagnosis not present

## 2019-08-02 DIAGNOSIS — M25511 Pain in right shoulder: Secondary | ICD-10-CM | POA: Diagnosis not present

## 2019-08-05 DIAGNOSIS — S46011D Strain of muscle(s) and tendon(s) of the rotator cuff of right shoulder, subsequent encounter: Secondary | ICD-10-CM | POA: Diagnosis not present

## 2019-08-15 DIAGNOSIS — S46011A Strain of muscle(s) and tendon(s) of the rotator cuff of right shoulder, initial encounter: Secondary | ICD-10-CM | POA: Diagnosis not present

## 2019-08-15 DIAGNOSIS — S4381XA Sprain of other specified parts of right shoulder girdle, initial encounter: Secondary | ICD-10-CM | POA: Diagnosis not present

## 2019-08-15 DIAGNOSIS — M19011 Primary osteoarthritis, right shoulder: Secondary | ICD-10-CM | POA: Diagnosis not present

## 2019-08-15 DIAGNOSIS — M7521 Bicipital tendinitis, right shoulder: Secondary | ICD-10-CM | POA: Diagnosis not present

## 2019-08-15 DIAGNOSIS — M7541 Impingement syndrome of right shoulder: Secondary | ICD-10-CM | POA: Diagnosis not present

## 2019-08-15 DIAGNOSIS — M24111 Other articular cartilage disorders, right shoulder: Secondary | ICD-10-CM | POA: Diagnosis not present

## 2019-08-15 DIAGNOSIS — G8918 Other acute postprocedural pain: Secondary | ICD-10-CM | POA: Diagnosis not present

## 2019-08-15 DIAGNOSIS — S46811A Strain of other muscles, fascia and tendons at shoulder and upper arm level, right arm, initial encounter: Secondary | ICD-10-CM | POA: Diagnosis not present

## 2019-08-15 DIAGNOSIS — Y999 Unspecified external cause status: Secondary | ICD-10-CM | POA: Diagnosis not present

## 2019-08-15 DIAGNOSIS — X58XXXA Exposure to other specified factors, initial encounter: Secondary | ICD-10-CM | POA: Diagnosis not present

## 2019-08-27 DIAGNOSIS — M6281 Muscle weakness (generalized): Secondary | ICD-10-CM | POA: Diagnosis not present

## 2019-08-27 DIAGNOSIS — S46011D Strain of muscle(s) and tendon(s) of the rotator cuff of right shoulder, subsequent encounter: Secondary | ICD-10-CM | POA: Diagnosis not present

## 2019-08-27 DIAGNOSIS — M25511 Pain in right shoulder: Secondary | ICD-10-CM | POA: Diagnosis not present

## 2019-08-27 DIAGNOSIS — M25611 Stiffness of right shoulder, not elsewhere classified: Secondary | ICD-10-CM | POA: Diagnosis not present

## 2019-09-03 DIAGNOSIS — M6281 Muscle weakness (generalized): Secondary | ICD-10-CM | POA: Diagnosis not present

## 2019-09-03 DIAGNOSIS — M25511 Pain in right shoulder: Secondary | ICD-10-CM | POA: Diagnosis not present

## 2019-09-03 DIAGNOSIS — M25611 Stiffness of right shoulder, not elsewhere classified: Secondary | ICD-10-CM | POA: Diagnosis not present

## 2019-09-03 DIAGNOSIS — S46011D Strain of muscle(s) and tendon(s) of the rotator cuff of right shoulder, subsequent encounter: Secondary | ICD-10-CM | POA: Diagnosis not present

## 2019-09-17 DIAGNOSIS — M6281 Muscle weakness (generalized): Secondary | ICD-10-CM | POA: Diagnosis not present

## 2019-09-17 DIAGNOSIS — S46011D Strain of muscle(s) and tendon(s) of the rotator cuff of right shoulder, subsequent encounter: Secondary | ICD-10-CM | POA: Diagnosis not present

## 2019-09-17 DIAGNOSIS — M25511 Pain in right shoulder: Secondary | ICD-10-CM | POA: Diagnosis not present

## 2019-09-17 DIAGNOSIS — M25611 Stiffness of right shoulder, not elsewhere classified: Secondary | ICD-10-CM | POA: Diagnosis not present

## 2019-09-23 DIAGNOSIS — M25511 Pain in right shoulder: Secondary | ICD-10-CM | POA: Diagnosis not present

## 2019-09-23 DIAGNOSIS — M6281 Muscle weakness (generalized): Secondary | ICD-10-CM | POA: Diagnosis not present

## 2019-09-23 DIAGNOSIS — M25611 Stiffness of right shoulder, not elsewhere classified: Secondary | ICD-10-CM | POA: Diagnosis not present

## 2019-09-23 DIAGNOSIS — S46011D Strain of muscle(s) and tendon(s) of the rotator cuff of right shoulder, subsequent encounter: Secondary | ICD-10-CM | POA: Diagnosis not present

## 2019-09-26 DIAGNOSIS — M25611 Stiffness of right shoulder, not elsewhere classified: Secondary | ICD-10-CM | POA: Diagnosis not present

## 2019-09-26 DIAGNOSIS — M25511 Pain in right shoulder: Secondary | ICD-10-CM | POA: Diagnosis not present

## 2019-09-26 DIAGNOSIS — S46011D Strain of muscle(s) and tendon(s) of the rotator cuff of right shoulder, subsequent encounter: Secondary | ICD-10-CM | POA: Diagnosis not present

## 2019-09-26 DIAGNOSIS — M6281 Muscle weakness (generalized): Secondary | ICD-10-CM | POA: Diagnosis not present

## 2019-09-30 DIAGNOSIS — S46011D Strain of muscle(s) and tendon(s) of the rotator cuff of right shoulder, subsequent encounter: Secondary | ICD-10-CM | POA: Diagnosis not present

## 2019-09-30 DIAGNOSIS — M6281 Muscle weakness (generalized): Secondary | ICD-10-CM | POA: Diagnosis not present

## 2019-09-30 DIAGNOSIS — M25611 Stiffness of right shoulder, not elsewhere classified: Secondary | ICD-10-CM | POA: Diagnosis not present

## 2019-09-30 DIAGNOSIS — M25511 Pain in right shoulder: Secondary | ICD-10-CM | POA: Diagnosis not present

## 2019-10-03 DIAGNOSIS — M25511 Pain in right shoulder: Secondary | ICD-10-CM | POA: Diagnosis not present

## 2019-10-03 DIAGNOSIS — M6281 Muscle weakness (generalized): Secondary | ICD-10-CM | POA: Diagnosis not present

## 2019-10-03 DIAGNOSIS — M25611 Stiffness of right shoulder, not elsewhere classified: Secondary | ICD-10-CM | POA: Diagnosis not present

## 2019-10-03 DIAGNOSIS — S46011D Strain of muscle(s) and tendon(s) of the rotator cuff of right shoulder, subsequent encounter: Secondary | ICD-10-CM | POA: Diagnosis not present

## 2019-10-07 DIAGNOSIS — M6281 Muscle weakness (generalized): Secondary | ICD-10-CM | POA: Diagnosis not present

## 2019-10-07 DIAGNOSIS — M25611 Stiffness of right shoulder, not elsewhere classified: Secondary | ICD-10-CM | POA: Diagnosis not present

## 2019-10-07 DIAGNOSIS — M25511 Pain in right shoulder: Secondary | ICD-10-CM | POA: Diagnosis not present

## 2019-10-07 DIAGNOSIS — S46011D Strain of muscle(s) and tendon(s) of the rotator cuff of right shoulder, subsequent encounter: Secondary | ICD-10-CM | POA: Diagnosis not present

## 2019-10-10 DIAGNOSIS — M25511 Pain in right shoulder: Secondary | ICD-10-CM | POA: Diagnosis not present

## 2019-10-10 DIAGNOSIS — M25611 Stiffness of right shoulder, not elsewhere classified: Secondary | ICD-10-CM | POA: Diagnosis not present

## 2019-10-10 DIAGNOSIS — M6281 Muscle weakness (generalized): Secondary | ICD-10-CM | POA: Diagnosis not present

## 2019-10-10 DIAGNOSIS — S46011D Strain of muscle(s) and tendon(s) of the rotator cuff of right shoulder, subsequent encounter: Secondary | ICD-10-CM | POA: Diagnosis not present

## 2019-10-14 DIAGNOSIS — S46011D Strain of muscle(s) and tendon(s) of the rotator cuff of right shoulder, subsequent encounter: Secondary | ICD-10-CM | POA: Diagnosis not present

## 2019-10-14 DIAGNOSIS — M25511 Pain in right shoulder: Secondary | ICD-10-CM | POA: Diagnosis not present

## 2019-10-14 DIAGNOSIS — M6281 Muscle weakness (generalized): Secondary | ICD-10-CM | POA: Diagnosis not present

## 2019-10-14 DIAGNOSIS — M25611 Stiffness of right shoulder, not elsewhere classified: Secondary | ICD-10-CM | POA: Diagnosis not present

## 2019-10-17 DIAGNOSIS — S46011D Strain of muscle(s) and tendon(s) of the rotator cuff of right shoulder, subsequent encounter: Secondary | ICD-10-CM | POA: Diagnosis not present

## 2019-10-17 DIAGNOSIS — M25511 Pain in right shoulder: Secondary | ICD-10-CM | POA: Diagnosis not present

## 2019-10-17 DIAGNOSIS — M6281 Muscle weakness (generalized): Secondary | ICD-10-CM | POA: Diagnosis not present

## 2019-10-17 DIAGNOSIS — M25611 Stiffness of right shoulder, not elsewhere classified: Secondary | ICD-10-CM | POA: Diagnosis not present

## 2019-10-21 DIAGNOSIS — S46011D Strain of muscle(s) and tendon(s) of the rotator cuff of right shoulder, subsequent encounter: Secondary | ICD-10-CM | POA: Diagnosis not present

## 2019-10-21 DIAGNOSIS — M25511 Pain in right shoulder: Secondary | ICD-10-CM | POA: Diagnosis not present

## 2019-10-21 DIAGNOSIS — M25611 Stiffness of right shoulder, not elsewhere classified: Secondary | ICD-10-CM | POA: Diagnosis not present

## 2019-10-21 DIAGNOSIS — M6281 Muscle weakness (generalized): Secondary | ICD-10-CM | POA: Diagnosis not present

## 2019-10-24 DIAGNOSIS — M25611 Stiffness of right shoulder, not elsewhere classified: Secondary | ICD-10-CM | POA: Diagnosis not present

## 2019-10-24 DIAGNOSIS — M6281 Muscle weakness (generalized): Secondary | ICD-10-CM | POA: Diagnosis not present

## 2019-10-24 DIAGNOSIS — M25511 Pain in right shoulder: Secondary | ICD-10-CM | POA: Diagnosis not present

## 2019-10-24 DIAGNOSIS — S46011D Strain of muscle(s) and tendon(s) of the rotator cuff of right shoulder, subsequent encounter: Secondary | ICD-10-CM | POA: Diagnosis not present

## 2019-10-28 DIAGNOSIS — M25611 Stiffness of right shoulder, not elsewhere classified: Secondary | ICD-10-CM | POA: Diagnosis not present

## 2019-10-28 DIAGNOSIS — M25511 Pain in right shoulder: Secondary | ICD-10-CM | POA: Diagnosis not present

## 2019-10-28 DIAGNOSIS — S46011D Strain of muscle(s) and tendon(s) of the rotator cuff of right shoulder, subsequent encounter: Secondary | ICD-10-CM | POA: Diagnosis not present

## 2019-10-28 DIAGNOSIS — M6281 Muscle weakness (generalized): Secondary | ICD-10-CM | POA: Diagnosis not present

## 2019-10-31 DIAGNOSIS — S46011D Strain of muscle(s) and tendon(s) of the rotator cuff of right shoulder, subsequent encounter: Secondary | ICD-10-CM | POA: Diagnosis not present

## 2019-10-31 DIAGNOSIS — M6281 Muscle weakness (generalized): Secondary | ICD-10-CM | POA: Diagnosis not present

## 2019-10-31 DIAGNOSIS — M25611 Stiffness of right shoulder, not elsewhere classified: Secondary | ICD-10-CM | POA: Diagnosis not present

## 2019-10-31 DIAGNOSIS — M25511 Pain in right shoulder: Secondary | ICD-10-CM | POA: Diagnosis not present

## 2019-11-04 DIAGNOSIS — M6281 Muscle weakness (generalized): Secondary | ICD-10-CM | POA: Diagnosis not present

## 2019-11-04 DIAGNOSIS — M25511 Pain in right shoulder: Secondary | ICD-10-CM | POA: Diagnosis not present

## 2019-11-04 DIAGNOSIS — S46011D Strain of muscle(s) and tendon(s) of the rotator cuff of right shoulder, subsequent encounter: Secondary | ICD-10-CM | POA: Diagnosis not present

## 2019-11-04 DIAGNOSIS — M25611 Stiffness of right shoulder, not elsewhere classified: Secondary | ICD-10-CM | POA: Diagnosis not present

## 2019-11-07 DIAGNOSIS — M25611 Stiffness of right shoulder, not elsewhere classified: Secondary | ICD-10-CM | POA: Diagnosis not present

## 2019-11-07 DIAGNOSIS — M25511 Pain in right shoulder: Secondary | ICD-10-CM | POA: Diagnosis not present

## 2019-11-07 DIAGNOSIS — M6281 Muscle weakness (generalized): Secondary | ICD-10-CM | POA: Diagnosis not present

## 2019-11-07 DIAGNOSIS — S46011D Strain of muscle(s) and tendon(s) of the rotator cuff of right shoulder, subsequent encounter: Secondary | ICD-10-CM | POA: Diagnosis not present

## 2019-11-11 DIAGNOSIS — M6281 Muscle weakness (generalized): Secondary | ICD-10-CM | POA: Diagnosis not present

## 2019-11-11 DIAGNOSIS — M25511 Pain in right shoulder: Secondary | ICD-10-CM | POA: Diagnosis not present

## 2019-11-11 DIAGNOSIS — S46011D Strain of muscle(s) and tendon(s) of the rotator cuff of right shoulder, subsequent encounter: Secondary | ICD-10-CM | POA: Diagnosis not present

## 2019-11-11 DIAGNOSIS — M25611 Stiffness of right shoulder, not elsewhere classified: Secondary | ICD-10-CM | POA: Diagnosis not present

## 2019-11-14 DIAGNOSIS — M25511 Pain in right shoulder: Secondary | ICD-10-CM | POA: Diagnosis not present

## 2019-11-14 DIAGNOSIS — M6281 Muscle weakness (generalized): Secondary | ICD-10-CM | POA: Diagnosis not present

## 2019-11-14 DIAGNOSIS — S46011D Strain of muscle(s) and tendon(s) of the rotator cuff of right shoulder, subsequent encounter: Secondary | ICD-10-CM | POA: Diagnosis not present

## 2019-11-14 DIAGNOSIS — M25611 Stiffness of right shoulder, not elsewhere classified: Secondary | ICD-10-CM | POA: Diagnosis not present

## 2019-11-18 DIAGNOSIS — M25611 Stiffness of right shoulder, not elsewhere classified: Secondary | ICD-10-CM | POA: Diagnosis not present

## 2019-11-18 DIAGNOSIS — M6281 Muscle weakness (generalized): Secondary | ICD-10-CM | POA: Diagnosis not present

## 2019-11-18 DIAGNOSIS — M25511 Pain in right shoulder: Secondary | ICD-10-CM | POA: Diagnosis not present

## 2019-11-18 DIAGNOSIS — S46011D Strain of muscle(s) and tendon(s) of the rotator cuff of right shoulder, subsequent encounter: Secondary | ICD-10-CM | POA: Diagnosis not present

## 2019-11-21 DIAGNOSIS — M6281 Muscle weakness (generalized): Secondary | ICD-10-CM | POA: Diagnosis not present

## 2019-11-21 DIAGNOSIS — S46011D Strain of muscle(s) and tendon(s) of the rotator cuff of right shoulder, subsequent encounter: Secondary | ICD-10-CM | POA: Diagnosis not present

## 2019-11-21 DIAGNOSIS — M25511 Pain in right shoulder: Secondary | ICD-10-CM | POA: Diagnosis not present

## 2019-11-21 DIAGNOSIS — M25611 Stiffness of right shoulder, not elsewhere classified: Secondary | ICD-10-CM | POA: Diagnosis not present

## 2019-11-25 DIAGNOSIS — S46011D Strain of muscle(s) and tendon(s) of the rotator cuff of right shoulder, subsequent encounter: Secondary | ICD-10-CM | POA: Diagnosis not present

## 2019-11-25 DIAGNOSIS — M25611 Stiffness of right shoulder, not elsewhere classified: Secondary | ICD-10-CM | POA: Diagnosis not present

## 2019-11-25 DIAGNOSIS — M6281 Muscle weakness (generalized): Secondary | ICD-10-CM | POA: Diagnosis not present

## 2019-11-25 DIAGNOSIS — M25511 Pain in right shoulder: Secondary | ICD-10-CM | POA: Diagnosis not present

## 2019-11-28 DIAGNOSIS — M25511 Pain in right shoulder: Secondary | ICD-10-CM | POA: Diagnosis not present

## 2019-11-28 DIAGNOSIS — M6281 Muscle weakness (generalized): Secondary | ICD-10-CM | POA: Diagnosis not present

## 2019-11-28 DIAGNOSIS — M25611 Stiffness of right shoulder, not elsewhere classified: Secondary | ICD-10-CM | POA: Diagnosis not present

## 2019-11-28 DIAGNOSIS — S46011D Strain of muscle(s) and tendon(s) of the rotator cuff of right shoulder, subsequent encounter: Secondary | ICD-10-CM | POA: Diagnosis not present

## 2019-12-05 DIAGNOSIS — M25611 Stiffness of right shoulder, not elsewhere classified: Secondary | ICD-10-CM | POA: Diagnosis not present

## 2019-12-05 DIAGNOSIS — M6281 Muscle weakness (generalized): Secondary | ICD-10-CM | POA: Diagnosis not present

## 2019-12-05 DIAGNOSIS — M25511 Pain in right shoulder: Secondary | ICD-10-CM | POA: Diagnosis not present

## 2019-12-05 DIAGNOSIS — S46011D Strain of muscle(s) and tendon(s) of the rotator cuff of right shoulder, subsequent encounter: Secondary | ICD-10-CM | POA: Diagnosis not present

## 2020-02-24 DIAGNOSIS — D485 Neoplasm of uncertain behavior of skin: Secondary | ICD-10-CM | POA: Diagnosis not present

## 2020-02-24 DIAGNOSIS — D225 Melanocytic nevi of trunk: Secondary | ICD-10-CM | POA: Diagnosis not present

## 2020-02-24 DIAGNOSIS — L570 Actinic keratosis: Secondary | ICD-10-CM | POA: Diagnosis not present

## 2020-02-24 DIAGNOSIS — X32XXXD Exposure to sunlight, subsequent encounter: Secondary | ICD-10-CM | POA: Diagnosis not present

## 2020-09-22 DIAGNOSIS — M4726 Other spondylosis with radiculopathy, lumbar region: Secondary | ICD-10-CM | POA: Diagnosis not present

## 2020-09-22 DIAGNOSIS — M4716 Other spondylosis with myelopathy, lumbar region: Secondary | ICD-10-CM | POA: Diagnosis not present

## 2020-09-22 DIAGNOSIS — I10 Essential (primary) hypertension: Secondary | ICD-10-CM | POA: Diagnosis not present

## 2020-09-22 DIAGNOSIS — M545 Low back pain, unspecified: Secondary | ICD-10-CM | POA: Diagnosis not present

## 2020-09-28 ENCOUNTER — Other Ambulatory Visit (HOSPITAL_COMMUNITY): Payer: Self-pay | Admitting: Orthopaedic Surgery

## 2020-09-28 ENCOUNTER — Other Ambulatory Visit: Payer: Self-pay | Admitting: Orthopaedic Surgery

## 2020-09-28 DIAGNOSIS — M4726 Other spondylosis with radiculopathy, lumbar region: Secondary | ICD-10-CM

## 2020-10-02 ENCOUNTER — Ambulatory Visit (HOSPITAL_COMMUNITY)
Admission: RE | Admit: 2020-10-02 | Discharge: 2020-10-02 | Disposition: A | Discharge status: Home / Self Care | Payer: Medicare Other | Source: Ambulatory Visit | Attending: Orthopaedic Surgery | Admitting: Orthopaedic Surgery

## 2020-10-02 DIAGNOSIS — M545 Low back pain, unspecified: Secondary | ICD-10-CM | POA: Diagnosis not present

## 2020-10-02 DIAGNOSIS — M4726 Other spondylosis with radiculopathy, lumbar region: Secondary | ICD-10-CM | POA: Diagnosis not present

## 2020-10-29 DIAGNOSIS — M4726 Other spondylosis with radiculopathy, lumbar region: Secondary | ICD-10-CM | POA: Diagnosis not present

## 2020-10-29 DIAGNOSIS — M545 Low back pain, unspecified: Secondary | ICD-10-CM | POA: Diagnosis not present

## 2020-10-29 DIAGNOSIS — M5416 Radiculopathy, lumbar region: Secondary | ICD-10-CM | POA: Diagnosis not present

## 2020-10-31 ENCOUNTER — Other Ambulatory Visit: Payer: Self-pay

## 2020-10-31 ENCOUNTER — Encounter (HOSPITAL_COMMUNITY): Payer: Self-pay | Admitting: Emergency Medicine

## 2020-10-31 ENCOUNTER — Emergency Department (HOSPITAL_COMMUNITY): Payer: Medicare Other

## 2020-10-31 ENCOUNTER — Emergency Department (HOSPITAL_COMMUNITY)
Admission: EM | Admit: 2020-10-31 | Discharge: 2020-11-01 | Disposition: A | Discharge status: Home / Self Care | Payer: Medicare Other | Attending: Emergency Medicine | Admitting: Emergency Medicine

## 2020-10-31 DIAGNOSIS — M5442 Lumbago with sciatica, left side: Secondary | ICD-10-CM | POA: Insufficient documentation

## 2020-10-31 DIAGNOSIS — M25552 Pain in left hip: Secondary | ICD-10-CM | POA: Diagnosis not present

## 2020-10-31 DIAGNOSIS — X501XXA Overexertion from prolonged static or awkward postures, initial encounter: Secondary | ICD-10-CM | POA: Diagnosis not present

## 2020-10-31 DIAGNOSIS — I10 Essential (primary) hypertension: Secondary | ICD-10-CM | POA: Diagnosis not present

## 2020-10-31 DIAGNOSIS — R000 Tachycardia, unspecified: Secondary | ICD-10-CM | POA: Diagnosis not present

## 2020-10-31 DIAGNOSIS — M5432 Sciatica, left side: Secondary | ICD-10-CM

## 2020-10-31 DIAGNOSIS — Z79899 Other long term (current) drug therapy: Secondary | ICD-10-CM | POA: Insufficient documentation

## 2020-10-31 DIAGNOSIS — M545 Low back pain, unspecified: Secondary | ICD-10-CM | POA: Diagnosis not present

## 2020-10-31 DIAGNOSIS — M549 Dorsalgia, unspecified: Secondary | ICD-10-CM | POA: Diagnosis not present

## 2020-10-31 DIAGNOSIS — R110 Nausea: Secondary | ICD-10-CM | POA: Diagnosis not present

## 2020-10-31 DIAGNOSIS — Z7982 Long term (current) use of aspirin: Secondary | ICD-10-CM | POA: Insufficient documentation

## 2020-10-31 DIAGNOSIS — Z96641 Presence of right artificial hip joint: Secondary | ICD-10-CM | POA: Diagnosis not present

## 2020-10-31 DIAGNOSIS — R61 Generalized hyperhidrosis: Secondary | ICD-10-CM | POA: Diagnosis not present

## 2020-10-31 MED ORDER — METHOCARBAMOL 500 MG PO TABS
500.0000 mg | ORAL_TABLET | Freq: Once | ORAL | Status: AC
  Administered 2020-10-31: 500 mg via ORAL
  Filled 2020-10-31: qty 1

## 2020-10-31 MED ORDER — PREDNISONE 50 MG PO TABS
60.0000 mg | ORAL_TABLET | Freq: Once | ORAL | Status: AC
  Administered 2020-10-31: 60 mg via ORAL
  Filled 2020-10-31: qty 1

## 2020-10-31 NOTE — ED Provider Notes (Signed)
[facility] EMERGENCY DEPARTMENT Provider Note   CSN: [id number] Arrival date & time: 10/31/20  2313     History Chief Complaint  Patient presents with  . Back Pain    Thomas Hart is a 70 y.o. male.  Patient with history of hypertension, kidney stones, anxiety, sciatica presenting with severe left low back and buttock pain rating down his left leg.  Pain onset today as he was stepping off of a golf cart.  He was not playing golf but using a golf cart to get around [place].  He does this on a regular basis.  States he immediately had pain in his left buttock radiating down his left leg similar to previous episodes of sciatica.  He took an oxycodone at home without relief.  Denies any fall.  Denies any focal weakness, numbness or tingling.  Denies any bowel or bladder incontinence.  Denies any fever or vomiting.  Denies any history of IV drug abuse or cancer. States he had issues with his sciatica in the past and had an MRI in March that showed a bulging disc at L4-L5.  He was seen at the spine center and placed on steroids and gabapentin with improvement.  States he was not having any issues until he stepped off a golf cart today and the pain returned similar to previous episodes. Feels that his left leg is "useless" due to pain but is not having any true weakness.  No numbness or tingling.  He received 100 mcg of fentanyl by EMS. States she was not able to walk at home prior to EMS arrival. Denies any chest pain or abdominal pain.  Denies any difficulty breathing.  Denies any pain with urination or blood in the urine  The history is provided by the patient and the EMS personnel.  Back Pain Associated symptoms: no abdominal pain, no dysuria, no fever, no headaches and no weakness        Past Medical History:  Diagnosis Date  . Anxiety   . Hypertension   . Kidney stone   . Osteoarthritis    right hip  . Wears glasses     Patient Active Problem List   Diagnosis Date Noted  .  Primary osteoarthritis of right hip 06/21/2016  . Hip joint replacement status 09/17/2015    Past Surgical History:  Procedure Laterality Date  . LITHOTRIPSY    . ROTATOR CUFF REPAIR    . TOTAL HIP ARTHROPLASTY Right 09/17/2015   Procedure: RIGHT TOTAL HIP ARTHROPLASTY ANTERIOR APPROACH;  Surgeon: Leandrew Koyanagi, MD;  Location: [facility];  Service: Orthopedics;  Laterality: Right;  . WISDOM TOOTH EXTRACTION         Family History  Problem Relation Age of Onset  . Heart attack Father     Social History   Tobacco Use  . Smoking status: Never Smoker  . Smokeless tobacco: Never Used  Substance Use Topics  . Alcohol use: Yes    Comment: 1-2 cans of beer daily  . Drug use: No    Home Medications Prior to Admission medications   Medication Sig Start Date End Date Taking? Authorizing Provider  amLODipine (NORVASC) 5 MG tablet Take 5 mg by mouth daily. 08/20/15   [provider]  Ascorbic Acid (VITAMIN C) 500 MG CHEW Chew 1 tablet (500 mg total) by mouth 2 (two) times daily. 09/17/15   Leandrew Koyanagi, MD  aspirin EC 325 MG tablet Take 1 tablet (325 mg total) by mouth 2 (  two) times daily. Patient not taking: Reported on 06/21/2016 09/17/15   Leandrew Koyanagi, MD  carvedilol (COREG) 6.25 MG tablet Take 6.25 mg by mouth 2 (two) times daily. 07/28/15   [provider]  ibuprofen (ADVIL,MOTRIN) 400 MG tablet Take 400 mg by mouth every 6 (six) hours as needed for fever, headache or moderate pain.    [provider]  losartan-hydrochlorothiazide (HYZAAR) 100-25 MG tablet Take 1 tablet by mouth daily. 07/28/15   [provider]  methocarbamol (ROBAXIN) 750 MG tablet Take 1 tablet (750 mg total) by mouth 2 (two) times daily as needed for muscle spasms. Patient not taking: Reported on 06/21/2016 09/17/15   Leandrew Koyanagi, MD  milk thistle 175 MG tablet Take 175 mg by mouth daily.    [provider]  ondansetron (ZOFRAN) 4 MG tablet Take 1-2 tablets (4-8 mg total) by mouth  every 8 (eight) hours as needed for nausea or vomiting. Patient not taking: Reported on 06/21/2016 09/17/15   Leandrew Koyanagi, MD  oxyCODONE (OXY IR/ROXICODONE) 5 MG immediate release tablet Take 1-3 tablets (5-15 mg total) by mouth every 4 (four) hours as needed. Patient not taking: Reported on 06/21/2016 09/17/15   Leandrew Koyanagi, MD  oxyCODONE (OXYCONTIN) 10 mg 12 hr tablet Take 1 tablet (10 mg total) by mouth every 12 (twelve) hours. Patient not taking: Reported on 06/21/2016 09/17/15   Leandrew Koyanagi, MD  senna-docusate (SENOKOT S) 8.6-50 MG tablet Take 1 tablet by mouth at bedtime as needed. Patient not taking: Reported on 06/21/2016 09/17/15   Leandrew Koyanagi, MD  sertraline (ZOLOFT) 100 MG tablet Take 50 mg by mouth daily. 07/28/15   [provider]    Allergies    Tetanus toxoids  Review of Systems   Review of Systems  Constitutional: Negative for activity change, appetite change and fever.  HENT: Negative for congestion.   Respiratory: Negative for cough, chest tightness and shortness of breath.   Gastrointestinal: Negative for abdominal pain, nausea and vomiting.  Genitourinary: Negative for dysuria and hematuria.  Musculoskeletal: Positive for arthralgias, back pain and myalgias.  Neurological: Negative for dizziness, weakness and headaches.    all other systems are negative except as noted in the HPI and PMH.    Physical Exam Updated Vital Signs BP (!) 148/77 (BP Location: Right Arm)   Pulse 79   Temp 98.2 F (36.8 C) (Oral)   Resp 18   Ht 5\' 8"  (1.727 m)   Wt 79 kg   SpO2 98%   BMI 26.48 kg/m   Physical Exam Vitals and nursing note reviewed.  Constitutional:      General: He is not in acute distress.    Appearance: He is well-developed.     Comments: uncomfortable  HENT:     Head: Normocephalic and atraumatic.     Mouth/Throat:     Pharynx: No oropharyngeal exudate.  Eyes:     Conjunctiva/sclera: Conjunctivae normal.     Pupils: Pupils are equal, round, and  reactive to light.  Neck:     Comments: No meningismus. Cardiovascular:     Rate and Rhythm: Normal rate and regular rhythm.     Heart sounds: Normal heart sounds. No murmur heard.   Pulmonary:     Effort: Pulmonary effort is normal. No respiratory distress.     Breath sounds: Normal breath sounds.  Abdominal:     Palpations: Abdomen is soft.     Tenderness: There is no abdominal tenderness. There  is no guarding or rebound.  Musculoskeletal:        General: Tenderness present. Normal range of motion.     Cervical back: Normal range of motion and neck supple.     Comments: TTP L low back and SI joint.   Able to raise bilateral legs off the bed. Able to pivot to sitting position with legs hanging over side of bed. 5/5 strength in bilateral lower extremities. Ankle plantar and dorsiflexion intact. Great toe extension intact bilaterally. +2 DP and PT pulses. +2 patellar reflexes bilaterally. Normal gait.   Skin:    General: Skin is warm.  Neurological:     Mental Status: He is alert and oriented to person, place, and time.     Cranial Nerves: No cranial nerve deficit.     Motor: No abnormal muscle tone.     Coordination: Coordination normal.     Comments: No ataxia on finger to nose bilaterally. No pronator drift. 5/5 strength throughout. CN 2-12 intact.Equal grip strength. Sensation intact.   Psychiatric:        Behavior: Behavior normal.     ED Results / Procedures / Treatments   Labs (all labs ordered are listed, but only abnormal results are displayed) Labs Reviewed - No data to display  EKG None  Radiology DG Lumbar Spine Complete  Result Date: 11/01/2020 CLINICAL DATA:  Back pain EXAM: LUMBAR SPINE - COMPLETE 4+ VIEW COMPARISON:  None. FINDINGS: No subluxation or fracture. Diffuse degenerative disc disease. Degenerative facet disease in the lower lumbar spine. SI joints symmetric and unremarkable. IMPRESSION: Diffuse degenerative changes.  No acute bony abnormality.  Electronically Signed   By: Rolm Baptise M.D.   On: 11/01/2020 00:28   DG Hip Unilat W or Wo Pelvis 2-3 Views Left  Result Date: 11/01/2020 CLINICAL DATA:  Low back pain, left hip pain. EXAM: DG HIP (WITH OR WITHOUT PELVIS) 2-3V LEFT COMPARISON:  None. FINDINGS: Changes of right hip replacement. Degenerative changes in the left hip with spurring. No acute bony abnormality. Specifically, no fracture, subluxation, or dislocation. IMPRESSION: No acute bony abnormality. Electronically Signed   By: Rolm Baptise M.D.   On: 11/01/2020 00:29    Procedures Procedures   Medications Ordered in ED Medications  predniSONE (DELTASONE) tablet 60 mg (has no administration in time range)  methocarbamol (ROBAXIN) tablet 500 mg (has no administration in time range)    ED Course  I have reviewed the triage vital signs and the nursing notes.  Pertinent labs & imaging results that were available during my care of the patient were reviewed by me and considered in my medical decision making (see chart for details).  MRI 09/30/20:    IMPRESSION: 1. Multilevel degenerative changes of the lumbar spine as described above, worst at L4-5 where there is mild-to-moderate narrowing of the thecal sac, narrowing of the left subarticular zone and moderate right neural foraminal narrowing. 2. Severe bilateral neural foraminal narrowing at L5-S1. MDM Rules/Calculators/A&P                         Left buttock pain rating down left leg consistent with previous episodes of sciatica.  Difficulty walking at home.  He has pain on exam but no true weakness, numbness or tingling.  Intact distal strength, sensation and pulses and reflexes.  Low suspicion for cord compression or cauda equina.  MRI as above shows multilevel degenerative changes at L4-L5 with foraminal narrowing at L4-L5 and L5-S1.  Low  suspicion for acute aortic pathology  X-rays are negative for fractures.  Patient initially unable to ambulate very well due to  pain in his hip and leg.  Additional medications given including Dilaudid, Toradol and Valium.  Patient now feels much improved and is able to ambulate around the room without difficulty.  Suspect exacerbation of his sciatica.  Low suspicion for cord compression or cauda equina.  Low suspicion for kidney stone, UTI or aortic aneurysm.  We will initiate steroid course, muscle relaxers, follow-up with PCP and spine specialist.  Return precautions discussed. Final Clinical Impression(s) / ED Diagnoses Final diagnoses:  Sciatica of left side    Rx / DC Orders ED Discharge Orders    None       Gearold Wainer, Annie Main, MD 11/01/20 0530

## 2020-10-31 NOTE — ED Triage Notes (Addendum)
Pt was getting off golf cart today when he "felt a pop in his back". Pt with hx of disc problems and recently had an MRI (wife has results with her). Pt states he has a pinched nerve causing pain in L hip and travels down into L ankle. Pt was given 167mcg of Fentanyl en route by EMS. Pt also had taken 800mg  Motrin and 1 Oxycodone prior to EMS arrival.

## 2020-11-01 DIAGNOSIS — M25552 Pain in left hip: Secondary | ICD-10-CM | POA: Diagnosis not present

## 2020-11-01 DIAGNOSIS — M5442 Lumbago with sciatica, left side: Secondary | ICD-10-CM | POA: Diagnosis not present

## 2020-11-01 DIAGNOSIS — M545 Low back pain, unspecified: Secondary | ICD-10-CM | POA: Diagnosis not present

## 2020-11-01 MED ORDER — METHOCARBAMOL 500 MG PO TABS: 500.0000 mg | ORAL_TABLET | Freq: Three times a day (TID) | ORAL | 0 refills | Status: AC | PRN

## 2020-11-01 MED ORDER — KETOROLAC TROMETHAMINE 30 MG/ML IJ SOLN
30.0000 mg | Freq: Once | INTRAMUSCULAR | Status: AC
  Administered 2020-11-01: 30 mg via INTRAMUSCULAR
  Filled 2020-11-01: qty 1

## 2020-11-01 MED ORDER — METHYLPREDNISOLONE 4 MG PO TBPK: ORAL_TABLET | ORAL | 0 refills | Status: AC

## 2020-11-01 MED ORDER — DIAZEPAM 5 MG PO TABS
2.5000 mg | ORAL_TABLET | Freq: Once | ORAL | Status: AC
  Administered 2020-11-01: 2.5 mg via ORAL
  Filled 2020-11-01: qty 1

## 2020-11-01 MED ORDER — HYDROMORPHONE HCL 1 MG/ML IJ SOLN
1.0000 mg | Freq: Once | INTRAMUSCULAR | Status: AC
  Administered 2020-11-01: 1 mg via INTRAMUSCULAR
  Filled 2020-11-01: qty 1

## 2020-11-01 NOTE — Discharge Instructions (Signed)
Take the steroids and muscle relaxers as prescribed.  Do not drive or operate heavy machinery when taking the Robaxin.  Follow-up with your primary doctor as well as your spine specialist.  Return to the ED with worsening pain, weakness, numbness, tingling, loss of control of bowel or bladder or any other concerns.

## 2020-11-01 NOTE — ED Notes (Signed)
Pt was ambulated around the room, pt did just fine barely any pain while ambulating. Pt did not need help while ambulating. Pt got in and out of bed by himself and ambulated by himself.

## 2020-11-01 NOTE — ED Notes (Signed)
Pt attempted to walk, pt took two steps and almost fell. Pt stated he could not walk anymore and got back in bed. Pt stated that as soon as he puts any weight on the left leg that's when the pain shoots down his leg. Pt also stated that as soon as he gets back in the bed all pain stops.

## 2020-11-06 DIAGNOSIS — Z6828 Body mass index (BMI) 28.0-28.9, adult: Secondary | ICD-10-CM | POA: Diagnosis not present

## 2020-11-06 DIAGNOSIS — Z79899 Other long term (current) drug therapy: Secondary | ICD-10-CM | POA: Diagnosis not present

## 2020-11-06 DIAGNOSIS — M5416 Radiculopathy, lumbar region: Secondary | ICD-10-CM | POA: Diagnosis not present

## 2020-11-06 DIAGNOSIS — Z79891 Long term (current) use of opiate analgesic: Secondary | ICD-10-CM | POA: Diagnosis not present

## 2020-11-06 DIAGNOSIS — M4726 Other spondylosis with radiculopathy, lumbar region: Secondary | ICD-10-CM | POA: Diagnosis not present

## 2020-11-06 DIAGNOSIS — G894 Chronic pain syndrome: Secondary | ICD-10-CM | POA: Diagnosis not present

## 2020-11-06 DIAGNOSIS — M545 Low back pain, unspecified: Secondary | ICD-10-CM | POA: Diagnosis not present

## 2020-11-11 DIAGNOSIS — M5416 Radiculopathy, lumbar region: Secondary | ICD-10-CM | POA: Diagnosis not present

## 2020-11-12 ENCOUNTER — Ambulatory Visit (HOSPITAL_COMMUNITY): Payer: Medicare Other | Admitting: Physical Therapy

## 2020-11-17 ENCOUNTER — Other Ambulatory Visit: Payer: Self-pay

## 2020-11-17 ENCOUNTER — Encounter (HOSPITAL_COMMUNITY): Payer: Self-pay | Admitting: Physical Therapy

## 2020-11-17 ENCOUNTER — Ambulatory Visit (HOSPITAL_COMMUNITY): Payer: Medicare Other | Attending: Rehabilitation | Admitting: Physical Therapy

## 2020-11-17 DIAGNOSIS — R262 Difficulty in walking, not elsewhere classified: Secondary | ICD-10-CM | POA: Diagnosis not present

## 2020-11-17 DIAGNOSIS — M5416 Radiculopathy, lumbar region: Secondary | ICD-10-CM | POA: Insufficient documentation

## 2020-11-17 NOTE — Therapy (Signed)
[facility] [address] [place], [place], [address] Phone: [phone]   Fax:  [phone]  Physical Therapy Evaluation  Patient Details  Name: DASHAY SOELBERG MRN: [id number] Date of Birth: September 19, 1950 Referring Provider (PT): Letta Kocher   Encounter Date: 11/17/2020   PT End of Session - 11/17/20 1145    Visit Number 1    Number of Visits 12    Date for PT Re-Evaluation 12/29/20    Authorization Type Medicare/BCBS    PT Start Time 0915    PT Stop Time 1000    PT Time Calculation (min) 45 min    Activity Tolerance Patient tolerated treatment well    Behavior During Therapy [facility] for tasks assessed/performed           Past Medical History:  Diagnosis Date  . Anxiety   . Hypertension   . Kidney stone   . Osteoarthritis    right hip  . Wears glasses     Past Surgical History:  Procedure Laterality Date  . LITHOTRIPSY    . ROTATOR CUFF REPAIR    . TOTAL HIP ARTHROPLASTY Right 09/17/2015   Procedure: RIGHT TOTAL HIP ARTHROPLASTY ANTERIOR APPROACH;  Surgeon: Tarry Kos, MD;  Location: [facility] OR;  Service: Orthopedics;  Laterality: Right;  . WISDOM TOOTH EXTRACTION      There were no vitals filed for this visit.    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  He has had some bad falls and has chronic low back pain.  He had a flare up after loading a barrel on a trailer, he recieved a steroid pak and this completely took his pain away.  [place] and twisted his back which caused pain going down into his left leg to his toes.   The pain was so bad that he went to the ER.  He had an injection a week ago and he feels that he is about 30% better from the shot. He is now being referred for skilled PT .    Pertinent History B rotator cuff surgery, Rt THR,  OA    Limitations Walking    How long can you sit comfortably? no problem    How long can you  stand comfortably? 3 minutes    How long can you walk comfortably? limited to 3 minutes    Patient Stated Goals less pain, to be able to garden, to be able to walk and stand longer; sleep thru the night currently waking up 1 x a night    Currently in Pain? Yes    Pain Score 3    worst 10/10; best 2/10   Pain Location Leg    Pain Orientation Left    Pain Descriptors / Indicators Aching    Pain Type Chronic pain    Pain Onset More than a month ago    Pain Frequency Constant    Aggravating Factors  Wt bearing    Pain Relieving Factors medicine,    Effect of Pain on Daily Activities limits    Multiple Pain Sites No              [facility] PT Assessment - 11/17/20 0001      Assessment   Medical Diagnosis lumbar radiculopathy    Referring Provider (PT) Letta Kocher      Precautions   Precautions None  Restrictions   Weight Bearing Restrictions No      Balance Screen   Has the patient fallen in the past 6 months No      [place] residence      Prior Function   Level of Independence Independent      Cognition   Overall Cognitive Status Within Functional Limits for tasks assessed      Observation/Other Assessments   Focus on Therapeutic Outcomes (FOTO)  39; 61%      Functional Tests   Functional tests Single leg stance;Sit to Stand      Single Leg Stance   Comments Rt: 20";  LT 16"      Sit to Stand   Comments 13 in 30"      Posture/Postural Control   Posture/Postural Control Postural limitations    Postural Limitations Decreased lumbar lordosis;Decreased thoracic kyphosis;Posterior pelvic tilt      ROM / Strength   AROM / PROM / Strength AROM;Strength      AROM   AROM Assessment Site Lumbar    Lumbar Flexion fingers 12" from floor;with reps causing more pain going up    Lumbar Extension 26   reps slightly better or the same     Strength   Strength Assessment Site Hip;Knee;Ankle    Right/Left Hip Right;Left    Right  Hip Flexion 5/5    Right Hip Extension 5/5    Right Hip ABduction 5/5    Left Hip Flexion 5/5    Left Hip Extension 4-/5    Left Hip ABduction 3-/5    Right/Left Knee Right;Left    Right Knee Flexion 5/5    Right Knee Extension 5/5    Left Knee Flexion 5/5    Left Knee Extension 5/5    Right/Left Ankle Right;Left    Right Ankle Dorsiflexion 5/5    Right Ankle Plantar Flexion 5/5    Left Ankle Dorsiflexion 3/5    Left Ankle Plantar Flexion 4+/5                      Objective measurements completed on examination: See above findings.       [facility] Adult PT Treatment/Exercise - 11/17/20 0001      Exercises   Exercises Lumbar      Lumbar Exercises: Stretches   Standing Extension 10 reps      Lumbar Exercises: Sidelying   Hip Abduction 10 reps      Lumbar Exercises: Prone   Straight Leg Raise 10 reps                  PT Education - 11/17/20 1144    Education Details HEP    Person(s) Educated Patient    Methods Explanation;Handout;Verbal cues    Comprehension Verbalized understanding            PT Short Term Goals - 11/17/20 1154      PT SHORT TERM GOAL #1   Title Pt to be I in Hep to reduce nerve irritation to be able to state that his pain is not going below his knee level    Time 3    Period Weeks    Status New    Target Date 12/08/20      PT SHORT TERM GOAL #2   Title Pt to be able to walk for 10 minutes without increased sx of pain.    Time 3    Period Weeks  Status New      PT SHORT TERM GOAL #3   Title PT lt gluteus medius to increase 1/2 grade to decrease pt limp    Time 3    Period Weeks    Status New      PT SHORT TERM GOAL #4   Title Pt to be able to sleep without waking from his leg pain.    Time 3    Period Weeks    Status New             PT Long Term Goals - 11/17/20 1206      PT LONG TERM GOAL #1   Title PT to be I in advance HEP to allow pt to have no radicular sx.    Time 6    Period Weeks    Status  New    Target Date 12/29/20      PT LONG TERM GOAL #2   Title Pt Lt LE strength to be increased one grade to allow pt to walk without a limp.    Time 6    Period Weeks    Status New      PT LONG TERM GOAL #3   Title Pt to be able to single leg stance on both LE for 49 secondst for improved core strength and reduced risk of falling    Time 6    Period Weeks    Status New      PT LONG TERM GOAL #4   Title Pt to be able to walk for 30 minutes without having increased pain    Time 6    Period Weeks    Status New      PT LONG TERM GOAL #5   Title PT to be working in his garden    Time 6    Period Weeks                  Plan - 11/17/20 1146    Clinical Impression Statement Mr. Skellenger is a 70 yo male who has chronic back pain.  His recent acute episode began in early March, at this time he just had an injection a week ago and he is feeling 30% better.  He denies any true back pain, rather he is having radicular sx from his hip to his toes.  The pain is so great that he has stopped his gardening and is waking up at night.  He has been referred to skilled PT for evaluation and treatment; evaluation demonstrates, increased pain, decreased ROM, decreased strength, decreased balance, decreased activity tolerance, antalgic gait  as welll as postural dysfunction.  Mr. Gruszka will benefit from skilled PT to address these issues and improve his quality of life.    Personal Factors and Comorbidities Past/Current Experience;Time since onset of injury/illness/exacerbation    Examination-Activity Limitations Carry;Locomotion Level;Sleep;Stand;Stairs    Examination-Participation Restrictions Cleaning;Community Activity;Shop;Yard Work    [employer] officer Evolving/Moderate complexity    Clinical Decision Making Moderate    Rehab Potential Good    PT Frequency 2x / week    PT Duration 6 weeks    PT Treatment/Interventions Therapeutic exercise;Therapeutic  activities;Patient/family education;Manual techniques    PT Next Visit Plan begin hamstring stretch, POE, heel squeeze, sidelying clam, bridges and tband for ankle dorsiflexion, progress education on back care and higher back stability activity.;    PT Home Exercise Plan standing extension, sidelying abduction and prone hip extension  Patient will benefit from skilled therapeutic intervention in order to improve the following deficits and impairments:  Abnormal gait,Decreased activity tolerance,Decreased balance,Decreased mobility,Decreased range of motion,Difficulty walking,Decreased strength,Hypomobility,Postural dysfunction,Pain  Visit Diagnosis: Left lumbar radiculopathy - Plan: PT plan of care cert/re-cert  Difficulty in walking, not elsewhere classified - Plan: PT plan of care cert/re-cert     Problem List Patient Active Problem List   Diagnosis Date Noted  . Primary osteoarthritis of right hip 06/21/2016  . Hip joint replacement status 09/17/2015    Rayetta Humphrey, PT CLT [phone] 11/17/2020, 12:14 PM  [facility] [address] [place], [place], [address] Phone: [phone]   Fax:  [phone]  Name: VIDUR KNUST MRN: [id number] Date of Birth: 06-06-1951

## 2020-11-19 ENCOUNTER — Ambulatory Visit (HOSPITAL_COMMUNITY): Payer: Medicare Other | Admitting: Physical Therapy

## 2020-11-24 ENCOUNTER — Ambulatory Visit (HOSPITAL_COMMUNITY): Payer: Medicare Other

## 2020-11-24 ENCOUNTER — Encounter (HOSPITAL_COMMUNITY): Payer: Self-pay

## 2020-11-24 ENCOUNTER — Other Ambulatory Visit: Payer: Self-pay

## 2020-11-24 DIAGNOSIS — M5416 Radiculopathy, lumbar region: Secondary | ICD-10-CM

## 2020-11-24 DIAGNOSIS — R262 Difficulty in walking, not elsewhere classified: Secondary | ICD-10-CM | POA: Diagnosis not present

## 2020-11-24 NOTE — Therapy (Signed)
[place], [place], [address] Phone: [phone]   Fax:  [phone]  Physical Therapy Treatment  Patient Details  Name: Thomas Hart MRN: [id number] Date of Birth: 10/08/1950 Referring Provider (PT): Zonia Kief   Encounter Date: 11/24/2020   PT End of Session - 11/24/20 0836    Visit Number 2    Number of Visits 12    Date for PT Re-Evaluation 12/29/20    Authorization Type Medicare/BCBS    PT Start Time 0831    PT Stop Time 0916    PT Time Calculation (min) 45 min    Activity Tolerance Patient tolerated treatment well;No increased pain    Behavior During Therapy WFL for tasks assessed/performed           Past Medical History:  Diagnosis Date  . Anxiety   . Hypertension   . Kidney stone   . Osteoarthritis    right hip  . Wears glasses     Past Surgical History:  Procedure Laterality Date  . LITHOTRIPSY    . ROTATOR CUFF REPAIR    . TOTAL HIP ARTHROPLASTY Right 09/17/2015   Procedure: RIGHT TOTAL HIP ARTHROPLASTY ANTERIOR APPROACH;  Surgeon: Leandrew Koyanagi, MD;  Location: [facility];  Service: Orthopedics;  Laterality: Right;  . WISDOM TOOTH EXTRACTION      There were no vitals filed for this visit.   Subjective Assessment - 11/24/20 0833    Subjective Pt arrived wiht reports of increased pain Lt hip and radicular symptoms ending Lt foot with occasional reports numbness.  Reports he has began HEP some and has returned to the bed sleeping until 3 or 4:00 am.    Pertinent History B rotator cuff surgery, Rt THR,  OA    Patient Stated Goals less pain, to be able to garden, to be able to walk and stand longer; sleep thru the night currently waking up 1 x a night    Currently in Pain? Yes    Pain Score 7     Pain Location Leg    Pain Orientation Left    Pain Descriptors / Indicators Numbness;Aching    Pain Type Chronic pain    Pain Radiating Towards Lt LE down to foot    Pain Onset More than a month  ago    Pain Frequency Intermittent    Aggravating Factors  Weight bearing    Pain Relieving Factors medicine    Effect of Pain on Daily Activities limits    Multiple Pain Sites No                             [facility] Adult PT Treatment/Exercise - 11/24/20 0001      Posture/Postural Control   Posture/Postural Control Postural limitations    Postural Limitations Decreased lumbar lordosis;Decreased thoracic kyphosis;Posterior pelvic tilt      Exercises   Exercises Lumbar      Lumbar Exercises: Stretches   Active Hamstring Stretch 3 reps;30 seconds    Active Hamstring Stretch Limitations supine with hands behind knee    Standing Extension 10 reps;10 seconds      Lumbar Exercises: Supine   Bridge 5 reps;5 seconds    Bridge Limitations 2 sets      Lumbar Exercises: Sidelying   Clam Both;10 reps;3 seconds    Clam Limitations RTB around knee    Hip Abduction 5 reps    Hip Abduction Limitations  reviewed HEP      Lumbar Exercises: Prone   Straight Leg Raise 5 reps    Straight Leg Raises Limitations reviewed HEP                  PT Education - 11/24/20 0849    Education Details Reviewed goals, educated importance of HEP complaince, pt able to recall and demonstrated appropriately.  Cueing to breath through session and relax neck and gluteal tightness during lumbar extension.  Educated anatomy with spine and gluteal musculature.    Person(s) Educated Patient    Methods Explanation;Demonstration;Verbal cues    Comprehension Verbalized understanding            PT Short Term Goals - 11/17/20 1154      PT SHORT TERM GOAL #1   Title Pt to be I in Hep to reduce nerve irritation to be able to state that his pain is not going below his knee level    Time 3    Period Weeks    Status New    Target Date 12/08/20      PT SHORT TERM GOAL #2   Title Pt to be able to walk for 10 minutes without increased sx of pain.    Time 3    Period Weeks    Status New       PT SHORT TERM GOAL #3   Title PT lt gluteus medius to increase 1/2 grade to decrease pt limp    Time 3    Period Weeks    Status New      PT SHORT TERM GOAL #4   Title Pt to be able to sleep without waking from his leg pain.    Time 3    Period Weeks    Status New             PT Long Term Goals - 11/17/20 1206      PT LONG TERM GOAL #1   Title PT to be I in advance HEP to allow pt to have no radicular sx.    Time 6    Period Weeks    Status New    Target Date 12/29/20      PT LONG TERM GOAL #2   Title Pt Lt LE strength to be increased one grade to allow pt to walk without a limp.    Time 6    Period Weeks    Status New      PT LONG TERM GOAL #3   Title Pt to be able to single leg stance on both LE for 16 secondst for improved core strength and reduced risk of falling    Time 6    Period Weeks    Status New      PT LONG TERM GOAL #4   Title Pt to be able to walk for 30 minutes without having increased pain    Time 6    Period Weeks    Status New      PT LONG TERM GOAL #5   Title PT to be working in his garden    Time [place] - 11/24/20 3295    Clinical Impression Statement Reviewed goals, educated importance of HEP compliance for maximal benefits.  Pt able to recall current HEP, cueing for breathing through exerice and to relax neck and gluteal mm during lumbar  extension with reports of relief following.  Therex focus on LE and lumbar mobility and gluteal/core strengthening.  Pt able to complete all exercises with no reports of pain, cueing for form and mechanics initially.  EOS pt presents with no antalgic gait mechanics and significant pain reduction.  Given printout of additional exercises for gluteal strengthening and hamstring mobility.    Personal Factors and Comorbidities Past/Current Experience;Time since onset of injury/illness/exacerbation    Examination-Activity Limitations Carry;Locomotion  Level;Sleep;Stand;Stairs    Examination-Participation Restrictions Cleaning;Community Activity;Shop;Yard Work    [employer] officer Evolving/Moderate complexity    Clinical Decision Making Moderate    Rehab Potential Good    PT Frequency 2x / week    PT Duration 6 weeks    PT Treatment/Interventions Therapeutic exercise;Therapeutic activities;Patient/family education;Manual techniques    PT Next Visit Plan Next session begin theraband for ankle dorsiflexion, gait training/strengthening, prone press-up, progress education on back care and higher back stability activity.;    PT Home Exercise Plan standing extension, sidelying abduction and prone hip extension;  5/10:  bridge and hs stretch (supine wiht hands behind knee), clam with RTB around thigh    Consulted and Agree with Plan of Care Patient           Patient will benefit from skilled therapeutic intervention in order to improve the following deficits and impairments:  Abnormal gait,Decreased activity tolerance,Decreased balance,Decreased mobility,Decreased range of motion,Difficulty walking,Decreased strength,Hypomobility,Postural dysfunction,Pain  Visit Diagnosis: Difficulty in walking, not elsewhere classified  Left lumbar radiculopathy     Problem List Patient Active Problem List   Diagnosis Date Noted  . Primary osteoarthritis of right hip 06/21/2016  . Hip joint replacement status 09/17/2015   Ihor Austin, LPTA/CLT; CBIS [phone]  Aldona Lento 11/24/2020, 9:35 AM  [place], [place], [address] Phone: [phone]   Fax:  [phone]  Name: MAK BONNY MRN: [id number] Date of Birth: 1951-01-22

## 2020-11-26 ENCOUNTER — Other Ambulatory Visit: Payer: Self-pay

## 2020-11-26 ENCOUNTER — Ambulatory Visit (HOSPITAL_COMMUNITY): Payer: Medicare Other | Admitting: Physical Therapy

## 2020-11-26 ENCOUNTER — Encounter (HOSPITAL_COMMUNITY): Payer: Self-pay | Admitting: Physical Therapy

## 2020-11-26 DIAGNOSIS — M5416 Radiculopathy, lumbar region: Secondary | ICD-10-CM

## 2020-11-26 DIAGNOSIS — R262 Difficulty in walking, not elsewhere classified: Secondary | ICD-10-CM

## 2020-11-26 NOTE — Therapy (Signed)
[place], [place], [address] Phone: [phone]   Fax:  [phone]  Physical Therapy Treatment  Patient Details  Name: Thomas Hart MRN: [id number] Date of Birth: 08-Aug-1950 Referring Provider (PT): Zonia Kief   Encounter Date: 11/26/2020   PT End of Session - 11/26/20 0831    Visit Number 3    Number of Visits 12    Date for PT Re-Evaluation 12/29/20    Authorization Type Medicare/BCBS    PT Start Time 0831    PT Stop Time 0910    PT Time Calculation (min) 39 min    Activity Tolerance Patient tolerated treatment well;No increased pain    Behavior During Therapy WFL for tasks assessed/performed           Past Medical History:  Diagnosis Date  . Anxiety   . Hypertension   . Kidney stone   . Osteoarthritis    right hip  . Wears glasses     Past Surgical History:  Procedure Laterality Date  . LITHOTRIPSY    . ROTATOR CUFF REPAIR    . TOTAL HIP ARTHROPLASTY Right 09/17/2015   Procedure: RIGHT TOTAL HIP ARTHROPLASTY ANTERIOR APPROACH;  Surgeon: Leandrew Koyanagi, MD;  Location: [facility];  Service: Orthopedics;  Laterality: Right;  . WISDOM TOOTH EXTRACTION      There were no vitals filed for this visit.   Subjective Assessment - 11/26/20 0835    Subjective States that been having pain in the buttocks. States he was feeling 100% better after last session but the pain returned yesterday after his exercises. States he woke up about 2am this morning secondary to pain. States he currently has throbbing in left leg 2/10    Pertinent History B rotator cuff surgery, Rt THR,  OA    Patient Stated Goals less pain, to be able to garden, to be able to walk and stand longer; sleep thru the night currently waking up 1 x a night    Currently in Pain? Yes    Pain Score 2     Pain Location Leg    Pain Orientation Left    Pain Descriptors / Indicators Throbbing    Pain Onset More than a month ago               [facility] PT Assessment - 11/26/20 0001      Assessment   Medical Diagnosis lumbar radiculopathy    Referring Provider (PT) Zonia Kief                         [facility] Adult PT Treatment/Exercise - 11/26/20 0001      Ambulation/Gait   Gait Comments 10 minutes with practice and education of pushing self forward - practiced quick paced walking      Lumbar Exercises: Stretches   Press Ups 10 reps;5 seconds   2 sets     Lumbar Exercises: Standing   Other Standing Lumbar Exercises left hip dip at wall 2x5 5" holds PT assist    Other Standing Lumbar Exercises extensionat wall 2x10; hip extension/abd at counter 2x5 5" hold s      Lumbar Exercises: Quadruped   Straight Leg Raise 5 reps;5 seconds   1 set bilateral                 PT Education - 11/26/20 0843    Education Details Educated patient on peripheralization vs centralization and good and  bad symptoms. on frequency of exercises on difference between standing and prone variation of exercise and daily postures during the day. on lumbar roll and how to use it when sitting.    Person(s) Educated Patient    Methods Explanation    Comprehension Verbalized understanding            PT Short Term Goals - 11/17/20 1154      PT SHORT TERM GOAL #1   Title Pt to be I in Hep to reduce nerve irritation to be able to state that his pain is not going below his knee level    Time 3    Period Weeks    Status New    Target Date 12/08/20      PT SHORT TERM GOAL #2   Title Pt to be able to walk for 10 minutes without increased sx of pain.    Time 3    Period Weeks    Status New      PT SHORT TERM GOAL #3   Title PT lt gluteus medius to increase 1/2 grade to decrease pt limp    Time 3    Period Weeks    Status New      PT SHORT TERM GOAL #4   Title Pt to be able to sleep without waking from his leg pain.    Time 3    Period Weeks    Status New             PT Long Term Goals - 11/17/20 1206      PT LONG  TERM GOAL #1   Title PT to be I in advance HEP to allow pt to have no radicular sx.    Time 6    Period Weeks    Status New    Target Date 12/29/20      PT LONG TERM GOAL #2   Title Pt Lt LE strength to be increased one grade to allow pt to walk without a limp.    Time 6    Period Weeks    Status New      PT LONG TERM GOAL #3   Title Pt to be able to single leg stance on both LE for 62 secondst for improved core strength and reduced risk of falling    Time 6    Period Weeks    Status New      PT LONG TERM GOAL #4   Title Pt to be able to walk for 30 minutes without having increased pain    Time 6    Period Weeks    Status New      PT LONG TERM GOAL #5   Title PT to be working in his garden    Time 6    Period Weeks                 Plan - 11/26/20 0831    Clinical Impression Statement Reviewed HEP secondary to patient reporting possibly performing exercises incorrectly. Reduced symptoms with repeated extensions in prone. Symptoms reduced with repeated extension and left standing lateral shift. Educated patient on peripheralization versus centralization. Reduced symptoms in leg noted end of session but patient reported weakness/fatigue in legs. Will follow up with this next session.    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    PT Frequency 2x / week    PT Duration 6 weeks    PT Treatment/Interventions Therapeutic exercise;Therapeutic activities;Patient/family education;Manual techniques    PT Next Visit Plan Next session begin theraband for ankle dorsiflexion, gait training/strengthening, prone press-up, progress education on back care and higher back  stability activity.;    PT Home Exercise Plan standing extension, sidelying abduction and prone hip extension;  5/10:  bridge and hs stretch (supine wiht hands behind knee), clam with RTB around thigh 5/12 prone press ups, left lateral ship, fast paced walking    Consulted and Agree with Plan of Care Patient           Patient will benefit from skilled therapeutic intervention in order to improve the following deficits and impairments:  Abnormal gait,Decreased activity tolerance,Decreased balance,Decreased mobility,Decreased range of motion,Difficulty walking,Decreased strength,Hypomobility,Postural dysfunction,Pain  Visit Diagnosis: Difficulty in walking, not elsewhere classified  Left lumbar radiculopathy     Problem List Patient Active Problem List   Diagnosis Date Noted  . Primary osteoarthritis of right hip 06/21/2016  . Hip joint replacement status 09/17/2015   9:15 AM, 11/26/20 Jerene Pitch, DPT Physical Therapy with [facility]  [phone] office  [place] [address] [place], [place], [address] Phone: [phone]   Fax:  [phone]  Name: Thomas Hart MRN: [id number] Date of Birth: [birth year]

## 2020-12-01 ENCOUNTER — Encounter (HOSPITAL_COMMUNITY): Payer: Self-pay | Admitting: Physical Therapy

## 2020-12-01 ENCOUNTER — Ambulatory Visit (HOSPITAL_COMMUNITY): Payer: Medicare Other | Admitting: Physical Therapy

## 2020-12-01 ENCOUNTER — Other Ambulatory Visit: Payer: Self-pay

## 2020-12-01 DIAGNOSIS — M545 Low back pain, unspecified: Secondary | ICD-10-CM | POA: Diagnosis not present

## 2020-12-01 DIAGNOSIS — M4726 Other spondylosis with radiculopathy, lumbar region: Secondary | ICD-10-CM | POA: Diagnosis not present

## 2020-12-01 DIAGNOSIS — M5416 Radiculopathy, lumbar region: Secondary | ICD-10-CM | POA: Diagnosis not present

## 2020-12-01 DIAGNOSIS — R262 Difficulty in walking, not elsewhere classified: Secondary | ICD-10-CM | POA: Diagnosis not present

## 2020-12-01 NOTE — Therapy (Signed)
[place], [place], [address] Phone: [phone]   Fax:  [phone]  Physical Therapy Treatment  Patient Details  Name: Thomas Hart MRN: [id number] Date of Birth: Oct 12, 1950 Referring Provider (PT): Zonia Kief   Encounter Date: 12/01/2020   PT End of Session - 12/01/20 0746    Visit Number 4    Number of Visits 12    Date for PT Re-Evaluation 12/29/20    Authorization Type Medicare/BCBS    PT Start Time 0746    PT Stop Time 0818    PT Time Calculation (min) 32 min    Activity Tolerance Patient tolerated treatment well;No increased pain    Behavior During Therapy WFL for tasks assessed/performed           Past Medical History:  Diagnosis Date  . Anxiety   . Hypertension   . Kidney stone   . Osteoarthritis    right hip  . Wears glasses     Past Surgical History:  Procedure Laterality Date  . LITHOTRIPSY    . ROTATOR CUFF REPAIR    . TOTAL HIP ARTHROPLASTY Right 09/17/2015   Procedure: RIGHT TOTAL HIP ARTHROPLASTY ANTERIOR APPROACH;  Surgeon: Leandrew Koyanagi, MD;  Location: [place];  Service: Orthopedics;  Laterality: Right;  . WISDOM TOOTH EXTRACTION      There were no vitals filed for this visit.       [facility] PT Assessment - 12/01/20 0001      Assessment   Medical Diagnosis lumbar radiculopathy    Referring Provider (PT) Zonia Kief                         [facility] Adult PT Treatment/Exercise - 12/01/20 0001      Lumbar Exercises: Standing   Other Standing Lumbar Exercises toe raises with back up against the wall 2x10 5" holds Bilateral    Other Standing Lumbar Exercises lunges - mini with tactile cue x5 3 bilateral      Lumbar Exercises: Quadruped   Other Quadruped Lumbar Exercises prone lying 5 minutes, then press upx x20                  PT Education - 12/01/20 0800    Education Details educated patient on lawnmower and the vibration and affect it has on his  back, when to do his exercises and how to do his exercises.    Person(s) Educated Patient    Methods Explanation    Comprehension Verbalized understanding            PT Short Term Goals - 11/17/20 1154      PT SHORT TERM GOAL #1   Title Pt to be I in Hep to reduce nerve irritation to be able to state that his pain is not going below his knee level    Time 3    Period Weeks    Status New    Target Date 12/08/20      PT SHORT TERM GOAL #2   Title Pt to be able to walk for 10 minutes without increased sx of pain.    Time 3    Period Weeks    Status New      PT SHORT TERM GOAL #3   Title PT lt gluteus medius to increase 1/2 grade to decrease pt limp    Time 3    Period Weeks    Status New  PT SHORT TERM GOAL #4   Title Pt to be able to sleep without waking from his leg pain.    Time 3    Period Weeks    Status New             PT Long Term Goals - 11/17/20 1206      PT LONG TERM GOAL #1   Title PT to be I in advance HEP to allow pt to have no radicular sx.    Time 6    Period Weeks    Status New    Target Date 12/29/20      PT LONG TERM GOAL #2   Title Pt Lt LE strength to be increased one grade to allow pt to walk without a limp.    Time 6    Period Weeks    Status New      PT LONG TERM GOAL #3   Title Pt to be able to single leg stance on both LE for 23 secondst for improved core strength and reduced risk of falling    Time 6    Period Weeks    Status New      PT LONG TERM GOAL #4   Title Pt to be able to walk for 30 minutes without having increased pain    Time 6    Period Weeks    Status New      PT LONG TERM GOAL #5   Title PT to be working in his garden    Time [place] - 12/01/20 0817    Clinical Impression Statement Session focused on education and on importance of extension based exercises. Answered all questions. Added DF with posterior wall support to HEP. Ended session early secondary to  patient request as he had another MD appointment. Decreased leg symptoms and weakness noted end of session.    Personal Factors and Comorbidities Past/Current Experience;Time since onset of injury/illness/exacerbation    Examination-Activity Limitations Carry;Locomotion Level;Sleep;Stand;Stairs    Examination-Participation Restrictions Cleaning;Community Activity;Shop;Yard Work    [employer] officer Evolving/Moderate complexity    Rehab Potential Good    PT Frequency 2x / week    PT Duration 6 weeks    PT Treatment/Interventions Therapeutic exercise;Therapeutic activities;Patient/family education;Manual techniques    PT Next Visit Plan ankle dorsiflexion, gait training/strengthening, prone press-up, progress education on back care and higher back stability activity.;    PT Home Exercise Plan standing extension, sidelying abduction and prone hip extension;  5/10:  bridge and hs stretch (supine wiht hands behind knee), clam with RTB around thigh 5/12 prone press ups, left lateral ship, fast paced walking; DF at wall    Consulted and Agree with Plan of Care Patient           Patient will benefit from skilled therapeutic intervention in order to improve the following deficits and impairments:  Abnormal gait,Decreased activity tolerance,Decreased balance,Decreased mobility,Decreased range of motion,Difficulty walking,Decreased strength,Hypomobility,Postural dysfunction,Pain  Visit Diagnosis: Difficulty in walking, not elsewhere classified  Left lumbar radiculopathy     Problem List Patient Active Problem List   Diagnosis Date Noted  . Primary osteoarthritis of right hip 06/21/2016  . Hip joint replacement status 09/17/2015    8:20 AM, 12/01/20 Jerene Pitch, DPT Physical Therapy with [facility]  [phone] office  [place] [address]  [place], [place], [address] Phone: [phone]   Fax:   [phone]  Name: Thomas Hart MRN: [id number] Date of Birth: 10-13-1950

## 2020-12-03 ENCOUNTER — Encounter (HOSPITAL_COMMUNITY): Payer: Self-pay | Admitting: Physical Therapy

## 2020-12-03 ENCOUNTER — Ambulatory Visit (HOSPITAL_COMMUNITY): Payer: Medicare Other | Admitting: Physical Therapy

## 2020-12-03 ENCOUNTER — Other Ambulatory Visit: Payer: Self-pay

## 2020-12-03 DIAGNOSIS — R262 Difficulty in walking, not elsewhere classified: Secondary | ICD-10-CM

## 2020-12-03 DIAGNOSIS — M5416 Radiculopathy, lumbar region: Secondary | ICD-10-CM

## 2020-12-03 NOTE — Therapy (Signed)
[place], [place], [address] Phone: [phone]   Fax:  [phone]  Physical Therapy Treatment  Patient Details  Name: Thomas Hart MRN: [id number] Date of Birth: [birth year] Referring Provider (PT): Zonia Kief   Encounter Date: 12/03/2020   PT End of Session - 12/03/20 0747    Visit Number 5    Number of Visits 12    Date for PT Re-Evaluation 12/29/20    Authorization Type Medicare/BCBS    Progress Note Due on Visit 10    PT Start Time 0746    PT Stop Time 0825    PT Time Calculation (min) 39 min    Activity Tolerance Patient tolerated treatment well;No increased pain    Behavior During Therapy WFL for tasks assessed/performed           Past Medical History:  Diagnosis Date  . Anxiety   . Hypertension   . Kidney stone   . Osteoarthritis    right hip  . Wears glasses     Past Surgical History:  Procedure Laterality Date  . LITHOTRIPSY    . ROTATOR CUFF REPAIR    . TOTAL HIP ARTHROPLASTY Right 09/17/2015   Procedure: RIGHT TOTAL HIP ARTHROPLASTY ANTERIOR APPROACH;  Surgeon: Leandrew Koyanagi, MD;  Location: [place];  Service: Orthopedics;  Laterality: Right;  . WISDOM TOOTH EXTRACTION      There were no vitals filed for this visit.   Subjective Assessment - 12/03/20 0751    Subjective States on Tuesday after his apt he mowed the grass and then on Wednesday he had increased pain in his low back and it felt like he pulled a muscle on the left side, now it's migrated to the other side. Reports he was having symptoms in his legs and could barely walk. States current pain is 6/10 mostly in the low back and some in the lower left shin. States he tried his exercises but they didn't help.    Pertinent History B rotator cuff surgery, Rt THR,  OA    Patient Stated Goals less pain, to be able to garden, to be able to walk and stand longer; sleep thru the night currently waking up 1 x a night    Currently in Pain?  Yes    Pain Score 6     Pain Location Back    Pain Orientation Mid    Pain Descriptors / Indicators Aching;Throbbing;Tightness    Pain Onset More than a month ago              [facility] PT Assessment - 12/03/20 0001      Assessment   Medical Diagnosis lumbar radiculopathy    Referring Provider (PT) Zonia Kief                         [facility] Adult PT Treatment/Exercise - 12/03/20 0001      Lumbar Exercises: Stretches   Lower Trunk Rotation 60 seconds;2 reps      Lumbar Exercises: Standing   Other Standing Lumbar Exercises leaning over table traction - 5 minutes      Lumbar Exercises: Seated   Other Seated Lumbar Exercises seated self traction with arms on srm rests - 5 minutes      Lumbar Exercises: Supine   Other Supine Lumbar Exercises self traction - seated.      Modalities   Modalities Moist Heat      Moist Heat  Therapy   Number Minutes Moist Heat 15 Minutes    Moist Heat Location Lumbar Spine      Manual Therapy   Manual Therapy Manual Traction    Manual Traction on green ball with PT performing gentle oscillating traction - 14 minutes - with and without lumbar ROT                  PT Education - 12/03/20 0809    Education Details Educated patient on different forms of traction - hanging from pull up bar, inversion table and exercises. Educated patient on breath work to reduce tension on lumbar spine    Person(s) Educated Patient    Methods Explanation    Comprehension Verbalized understanding            PT Short Term Goals - 11/17/20 1154      PT SHORT TERM GOAL #1   Title Pt to be I in Hep to reduce nerve irritation to be able to state that his pain is not going below his knee level    Time 3    Period Weeks    Status New    Target Date 12/08/20      PT SHORT TERM GOAL #2   Title Pt to be able to walk for 10 minutes without increased sx of pain.    Time 3    Period Weeks    Status New      PT SHORT TERM GOAL #3   Title  PT lt gluteus medius to increase 1/2 grade to decrease pt limp    Time 3    Period Weeks    Status New      PT SHORT TERM GOAL #4   Title Pt to be able to sleep without waking from his leg pain.    Time 3    Period Weeks    Status New             PT Long Term Goals - 11/17/20 1206      PT LONG TERM GOAL #1   Title PT to be I in advance HEP to allow pt to have no radicular sx.    Time 6    Period Weeks    Status New    Target Date 12/29/20      PT LONG TERM GOAL #2   Title Pt Lt LE strength to be increased one grade to allow pt to walk without a limp.    Time 6    Period Weeks    Status New      PT LONG TERM GOAL #3   Title Pt to be able to single leg stance on both LE for 61 secondst for improved core strength and reduced risk of falling    Time 6    Period Weeks    Status New      PT LONG TERM GOAL #4   Title Pt to be able to walk for 30 minutes without having increased pain    Time 6    Period Weeks    Status New      PT LONG TERM GOAL #5   Title PT to be working in his garden    Time 6    Period Weeks                 Plan - 12/03/20 0830    Clinical Impression Statement Patient tolerated traction well today. Significantly reduced symptoms after manual traction. Educated patient in  self traction techniques and will follow up with this next session. Will add in core strengthening as tolerated next session after traction.    Personal Factors and Comorbidities Past/Current Experience;Time since onset of injury/illness/exacerbation    Examination-Activity Limitations Carry;Locomotion Level;Sleep;Stand;Stairs    Examination-Participation Restrictions Cleaning;Community Activity;Shop;Yard Work    [employer]chant [employer] officer Evolving/Moderate complexity    Rehab Potential Good    PT Frequency 2x / week    PT Duration 6 weeks    PT Treatment/Interventions Therapeutic exercise;Therapeutic activities;Patient/family education;Manual techniques    PT  Next Visit Plan lumbar traction machine, self traction techniques, Core/TRA activation, breath work    PT Home Exercise Plan standing extension, sidelying abduction and prone hip extension;  5/10:  bridge and hs stretch (supine wiht hands behind knee), clam with RTB around thigh 5/12 prone press ups, left lateral ship, fast paced walking; DF at wall    Consulted and Agree with Plan of Care Patient           Patient will benefit from skilled therapeutic intervention in order to improve the following deficits and impairments:  Abnormal gait,Decreased activity tolerance,Decreased balance,Decreased mobility,Decreased range of motion,Difficulty walking,Decreased strength,Hypomobility,Postural dysfunction,Pain  Visit Diagnosis: Difficulty in walking, not elsewhere classified  Left lumbar radiculopathy     Problem List Patient Active Problem List   Diagnosis Date Noted  . Primary osteoarthritis of right hip 06/21/2016  . Hip joint replacement status 09/17/2015    8:31 AM, 12/03/20 Jerene Pitch, DPT Physical Therapy with [facility]  [phone] office  [place] [address] [place], [place], [address] Phone: [phone]   Fax:  [phone]  Name: ANTINIO SANDERFER MRN: [id number] Date of Birth: [birth year]

## 2020-12-08 ENCOUNTER — Ambulatory Visit (HOSPITAL_COMMUNITY): Payer: Medicare Other

## 2020-12-08 ENCOUNTER — Encounter (HOSPITAL_COMMUNITY): Payer: Self-pay

## 2020-12-08 ENCOUNTER — Other Ambulatory Visit: Payer: Self-pay

## 2020-12-08 DIAGNOSIS — R262 Difficulty in walking, not elsewhere classified: Secondary | ICD-10-CM | POA: Diagnosis not present

## 2020-12-08 DIAGNOSIS — M5416 Radiculopathy, lumbar region: Secondary | ICD-10-CM

## 2020-12-08 NOTE — Therapy (Signed)
[place], [place], [address] Phone: [phone]   Fax:  [phone]  Physical Therapy Treatment  Patient Details  Name: Thomas Hart MRN: [id number] Date of Birth: 03/25/1951 Referring Provider (PT): Zonia Kief   Encounter Date: 12/08/2020   PT End of Session - 12/08/20 0839    Visit Number 6    Number of Visits 12    Date for PT Re-Evaluation 12/29/20    Authorization Type Medicare/BCBS    Progress Note Due on Visit 10    PT Start Time 0832    PT Stop Time 0913    PT Time Calculation (min) 41 min    Activity Tolerance Patient tolerated treatment well;No increased pain    Behavior During Therapy WFL for tasks assessed/performed           Past Medical History:  Diagnosis Date  . Anxiety   . Hypertension   . Kidney stone   . Osteoarthritis    right hip  . Wears glasses     Past Surgical History:  Procedure Laterality Date  . LITHOTRIPSY    . ROTATOR CUFF REPAIR    . TOTAL HIP ARTHROPLASTY Right 09/17/2015   Procedure: RIGHT TOTAL HIP ARTHROPLASTY ANTERIOR APPROACH;  Surgeon: Leandrew Koyanagi, MD;  Location: [facility];  Service: Orthopedics;  Laterality: Right;  . WISDOM TOOTH EXTRACTION      There were no vitals filed for this visit.   Subjective Assessment - 12/08/20 0835    Subjective Pt stated he continues to have radicular symptoms down Lt LE ending at feet, pain scale 2/10.  Reports improvements with ability to sleep all night.  Has began the chair traction at home.    Pertinent History B rotator cuff surgery, Rt THR,  OA    Patient Stated Goals less pain, to be able to garden, to be able to walk and stand longer; sleep thru the night currently waking up 1 x a night    Currently in Pain? Yes    Pain Score 2     Pain Location Leg    Pain Orientation Left    Pain Descriptors / Indicators Tingling;Numbness    Pain Type Chronic pain    Pain Radiating Towards Lt buttocks down Lt LE to foot    Pain  Onset More than a month ago    Pain Frequency Intermittent    Aggravating Factors  weight bearing    Pain Relieving Factors medicine    Effect of Pain on Daily Activities limits    Multiple Pain Sites No                             [facility] Adult PT Treatment/Exercise - 12/08/20 0001      Exercises   Exercises Lumbar      Lumbar Exercises: Stretches   Lower Trunk Rotation 5 reps;10 seconds    Standing Extension 10 reps;10 seconds    Prone on Elbows Stretch Limitations 5 min focus on deep breathing    Press Ups 10 reps;5 seconds      Lumbar Exercises: Standing   Functional Squats 10 reps    Functional Squats Limitations 3D hip excursion      Lumbar Exercises: Supine   Ab Set 10 reps;3 seconds    AB Set Limitations paired wiht exhale    Bridge 10 reps;3 seconds      Lumbar Exercises: Quadruped  Other Quadruped Lumbar Exercises prone lying 5 minutes focus on deep breathing, then press upx x20      Manual Therapy   Manual Therapy Manual Traction    Manual therapy comments Manual complete separate than rest of tx    Manual Traction on green ball with PT performing gentle oscillating traction - 10 minutes - with and without lumbar ROT                    PT Short Term Goals - 11/17/20 1154      PT SHORT TERM GOAL #1   Title Pt to be I in Hep to reduce nerve irritation to be able to state that his pain is not going below his knee level    Time 3    Period Weeks    Status New    Target Date 12/08/20      PT SHORT TERM GOAL #2   Title Pt to be able to walk for 10 minutes without increased sx of pain.    Time 3    Period Weeks    Status New      PT SHORT TERM GOAL #3   Title PT lt gluteus medius to increase 1/2 grade to decrease pt limp    Time 3    Period Weeks    Status New      PT SHORT TERM GOAL #4   Title Pt to be able to sleep without waking from his leg pain.    Time 3    Period Weeks    Status New             PT Long Term  Goals - 11/17/20 1206      PT LONG TERM GOAL #1   Title PT to be I in advance HEP to allow pt to have no radicular sx.    Time 6    Period Weeks    Status New    Target Date 12/29/20      PT LONG TERM GOAL #2   Title Pt Lt LE strength to be increased one grade to allow pt to walk without a limp.    Time 6    Period Weeks    Status New      PT LONG TERM GOAL #3   Title Pt to be able to single leg stance on both LE for 42 secondst for improved core strength and reduced risk of falling    Time 6    Period Weeks    Status New      PT LONG TERM GOAL #4   Title Pt to be able to walk for 30 minutes without having increased pain    Time 6    Period Weeks    Status New      PT LONG TERM GOAL #5   Title PT to be working in his garden    Time 6    Period Weeks                 Plan - 12/08/20 0855    Clinical Impression Statement Reports of radicular symptoms reduced with extension bias exercises.  Added proximal strengthening with cueing for mechanics for maximal strengthening and to reduce pressure anterior aspect of knees.  Educated on importance of breathing to reduce stress CNS.  Cueing for breathing paired wiht abdominal sets.  Reports of relief during manual traction on theraball, measured ball with reports for pt may purchase one for self.  If reports he does, progress to core strengthening and sitting posture on ball.    Personal Factors and Comorbidities Past/Current Experience;Time since onset of injury/illness/exacerbation    Examination-Activity Limitations Carry;Locomotion Level;Sleep;Stand;Stairs    Examination-Participation Restrictions Cleaning;Community Activity;Shop;Yard Work    [employer] officer Evolving/Moderate complexity    Clinical Decision Making Moderate    Rehab Potential Good    PT Frequency 2x / week    PT Duration 6 weeks    PT Treatment/Interventions Therapeutic exercise;Therapeutic activities;Patient/family education;Manual  techniques    PT Next Visit Plan lumbar traction machine, self traction techniques, Core/TRA activation, breath work    PT Home Exercise Plan standing extension, sidelying abduction and prone hip extension;  5/10:  bridge and hs stretch (supine wiht hands behind knee), clam with RTB around thigh 5/12 prone press ups, left lateral ship, fast paced walking; DF at wall    Consulted and Agree with Plan of Care Patient           Patient will benefit from skilled therapeutic intervention in order to improve the following deficits and impairments:  Abnormal gait,Decreased activity tolerance,Decreased balance,Decreased mobility,Decreased range of motion,Difficulty walking,Decreased strength,Hypomobility,Postural dysfunction,Pain  Visit Diagnosis: Left lumbar radiculopathy  Difficulty in walking, not elsewhere classified     Problem List Patient Active Problem List   Diagnosis Date Noted  . Primary osteoarthritis of right hip 06/21/2016  . Hip joint replacement status 09/17/2015   Ihor Austin, LPTA/CLT; CBIS [phone]  Aldona Lento 12/08/2020, 10:48 AM  [place], [place], [address] Phone: [phone]   Fax:  [phone]  Name: TIMON GEISSINGER MRN: [id number] Date of Birth: [birth year]

## 2020-12-10 ENCOUNTER — Encounter (HOSPITAL_COMMUNITY): Payer: Self-pay | Admitting: Physical Therapy

## 2020-12-10 ENCOUNTER — Other Ambulatory Visit: Payer: Self-pay

## 2020-12-10 ENCOUNTER — Ambulatory Visit (HOSPITAL_COMMUNITY): Payer: Medicare Other | Admitting: Physical Therapy

## 2020-12-10 DIAGNOSIS — R262 Difficulty in walking, not elsewhere classified: Secondary | ICD-10-CM

## 2020-12-10 DIAGNOSIS — M5416 Radiculopathy, lumbar region: Secondary | ICD-10-CM | POA: Diagnosis not present

## 2020-12-10 NOTE — Therapy (Signed)
[place], [place], [address] Phone: [phone]   Fax:  [phone]  Physical Therapy Treatment  Patient Details  Name: Thomas Hart MRN: [id number] Date of Birth: [birth year] Referring Provider (PT): Zonia Kief   Encounter Date: 12/10/2020   PT End of Session - 12/10/20 0833    Visit Number 7    Number of Visits 12    Date for PT Re-Evaluation 12/29/20    Authorization Type Medicare/BCBS    Progress Note Due on Visit 10    PT Start Time [phone]    PT Stop Time 0911    PT Time Calculation (min) 38 min    Activity Tolerance Patient tolerated treatment well;No increased pain    Behavior During Therapy WFL for tasks assessed/performed           Past Medical History:  Diagnosis Date  . Anxiety   . Hypertension   . Kidney stone   . Osteoarthritis    right hip  . Wears glasses     Past Surgical History:  Procedure Laterality Date  . LITHOTRIPSY    . ROTATOR CUFF REPAIR    . TOTAL HIP ARTHROPLASTY Right 09/17/2015   Procedure: RIGHT TOTAL HIP ARTHROPLASTY ANTERIOR APPROACH;  Surgeon: Leandrew Koyanagi, MD;  Location: [facility];  Service: Orthopedics;  Laterality: Right;  . WISDOM TOOTH EXTRACTION      There were no vitals filed for this visit.   Subjective Assessment - 12/10/20 0834    Subjective Patient states symptoms have been better. Symptoms down to L ankle today. Extension based exercise are helping.    Pertinent History B rotator cuff surgery, Rt THR,  OA    Patient Stated Goals less pain, to be able to garden, to be able to walk and stand longer; sleep thru the night currently waking up 1 x a night    Currently in Pain? Yes    Pain Score 1     Pain Location Leg    Pain Orientation Left    Pain Descriptors / Indicators Aching;Tingling    Pain Type Chronic pain    Pain Radiating Towards LLE to ankle    Pain Onset More than a month ago    Pain Frequency Constant                              [facility] Adult PT Treatment/Exercise - 12/10/20 0001      Lumbar Exercises: Stretches   Other Lumbar Stretch Exercise Piriformis stretch 5x 30 second holds LLE      Lumbar Exercises: Standing   Functional Squats 10 reps    Other Standing Lumbar Exercises lateral stepping 4x 15 feet bilateral      Lumbar Exercises: Supine   Bridge with clamshell 10 reps    Bridge with Ball Squeeze Limitations 2 sets with green band                  PT Education - 12/10/20 [phone]    Education Details HEP, exercise mechanics    Person(s) Educated Patient    Methods Explanation;Demonstration    Comprehension Verbalized understanding;Returned demonstration            PT Short Term Goals - 11/17/20 1154      PT SHORT TERM GOAL #1   Title Pt to be I in Hep to reduce nerve irritation to be able to state that his pain  is not going below his knee level    Time 3    Period Weeks    Status New    Target Date 12/08/20      PT SHORT TERM GOAL #2   Title Pt to be able to walk for 10 minutes without increased sx of pain.    Time 3    Period Weeks    Status New      PT SHORT TERM GOAL #3   Title PT lt gluteus medius to increase 1/2 grade to decrease pt limp    Time 3    Period Weeks    Status New      PT SHORT TERM GOAL #4   Title Pt to be able to sleep without waking from his leg pain.    Time 3    Period Weeks    Status New             PT Long Term Goals - 11/17/20 1206      PT LONG TERM GOAL #1   Title PT to be I in advance HEP to allow pt to have no radicular sx.    Time 6    Period Weeks    Status New    Target Date 12/29/20      PT LONG TERM GOAL #2   Title Pt Lt LE strength to be increased one grade to allow pt to walk without a limp.    Time 6    Period Weeks    Status New      PT LONG TERM GOAL #3   Title Pt to be able to single leg stance on both LE for 59 secondst for improved core strength and reduced risk of falling     Time 6    Period Weeks    Status New      PT LONG TERM GOAL #4   Title Pt to be able to walk for 30 minutes without having increased pain    Time 6    Period Weeks    Status New      PT LONG TERM GOAL #5   Title PT to be working in his garden    Time 6    Period Weeks                 Plan - 12/10/20 5621    Clinical Impression Statement Patient began session with supine piriformis stretch with decrease in symptoms following. Patient stating only LE weakness without symptoms following stretch. Patient completes bridge with clam with good mechanics which he states feels good with extension position. Patient performs standing strengthening exercises with emphasis on glute strengthening today. Patient with c/o slight pain in glute and L anterior tibial region following standing exercises but symptoms resolve with stretches. Patient will continue to benefit from skilled physical therapy in order to reduce impairment and improve function.    Personal Factors and Comorbidities Past/Current Experience;Time since onset of injury/illness/exacerbation    Examination-Activity Limitations Carry;Locomotion Level;Sleep;Stand;Stairs    Examination-Participation Restrictions Cleaning;Community Activity;Shop;Yard Work    [employer] Evolving/Moderate complexity    Rehab Potential Good    PT Frequency 2x / week    PT Duration 6 weeks    PT Treatment/Interventions Therapeutic exercise;Therapeutic activities;Patient/family education;Manual techniques    PT Next Visit Plan lumbar traction machine, self traction techniques, Core/TRA activation, breath work    PT Home Exercise Plan standing extension, sidelying abduction and prone hip extension;  5/10:  bridge  and hs stretch (supine wiht hands behind knee), clam with RTB around thigh 5/12 prone press ups, left lateral ship, fast paced walking; DF at wall 5/26 bridge with clam, piriformis stretch, lateral stepping    Consulted and  Agree with Plan of Care Patient           Patient will benefit from skilled therapeutic intervention in order to improve the following deficits and impairments:  Abnormal gait,Decreased activity tolerance,Decreased balance,Decreased mobility,Decreased range of motion,Difficulty walking,Decreased strength,Hypomobility,Postural dysfunction,Pain  Visit Diagnosis: Left lumbar radiculopathy  Difficulty in walking, not elsewhere classified     Problem List Patient Active Problem List   Diagnosis Date Noted  . Primary osteoarthritis of right hip 06/21/2016  . Hip joint replacement status 09/17/2015    9:14 AM, 12/10/20 Mearl Latin PT, DPT Physical Therapist at [facility] [place], [place], [address] Phone: [phone]   Fax:  [phone]  Name: Thomas Hart MRN: [id number] Date of Birth: 1951/02/21

## 2020-12-10 NOTE — Patient Instructions (Signed)
Access Code: [email or web address] URL: [email or web address] Date: 12/10/2020 Prepared by: Mitzi Hansen Xavyer Steenson  Exercises Supine Piriformis Stretch with Leg Straight (Mirrored) - 1 x daily - 7 x weekly - 3 reps - 30 seconds hold Supine Bridge with Resistance Band - 1 x daily - 7 x weekly - 2 sets - 10 reps Sidestepping - 1 x daily - 7 x weekly - 3 sets - 10 reps

## 2020-12-15 ENCOUNTER — Encounter (HOSPITAL_COMMUNITY): Payer: Self-pay | Admitting: Physical Therapy

## 2020-12-15 ENCOUNTER — Ambulatory Visit (HOSPITAL_COMMUNITY): Payer: Medicare Other | Admitting: Physical Therapy

## 2020-12-15 ENCOUNTER — Other Ambulatory Visit: Payer: Self-pay

## 2020-12-15 DIAGNOSIS — R262 Difficulty in walking, not elsewhere classified: Secondary | ICD-10-CM

## 2020-12-15 DIAGNOSIS — M5416 Radiculopathy, lumbar region: Secondary | ICD-10-CM

## 2020-12-15 NOTE — Patient Instructions (Signed)
Access Code: [email or web address] URL: [email or web address] Date: 12/15/2020 Prepared by: Mitzi Hansen Tiaria Biby  Exercises Prone Hip Extension - 1 x daily - 7 x weekly - 3 sets - 10 reps - 3 second hold

## 2020-12-15 NOTE — Therapy (Signed)
Thomas Hart, [place], [address] Phone: [phone]   Fax:  [phone]  Physical Therapy Treatment  Patient Details  Name: Thomas Hart MRN: [id number] Date of Birth: 05-03-1951 Referring Provider (PT): Zonia Kief   Encounter Date: 12/15/2020   PT End of Session - 12/15/20 0834    Visit Number 8    Number of Visits 12    Date for PT Re-Evaluation 12/29/20    Authorization Type Medicare/BCBS    Progress Note Due on Visit 10    PT Start Time 0092    PT Stop Time 0912    PT Time Calculation (min) 38 min    Activity Tolerance Patient tolerated treatment well;No increased pain    Behavior During Therapy WFL for tasks assessed/performed           Past Medical History:  Diagnosis Date  . Anxiety   . Hypertension   . Kidney stone   . Osteoarthritis    right hip  . Wears glasses     Past Surgical History:  Procedure Laterality Date  . LITHOTRIPSY    . ROTATOR CUFF REPAIR    . TOTAL HIP ARTHROPLASTY Right 09/17/2015   Procedure: RIGHT TOTAL HIP ARTHROPLASTY ANTERIOR APPROACH;  Surgeon: Leandrew Koyanagi, MD;  Location: [facility];  Service: Orthopedics;  Laterality: Right;  . WISDOM TOOTH EXTRACTION      There were no vitals filed for this visit.   Subjective Assessment - 12/15/20 0835    Subjective Patient states his back and leg are good and getting better. Some pain in hip and a little pain in front of L shin. He has been able to do more. He feels like he is finally getting over the hump. Exercises have been helpful.    Pertinent History B rotator cuff surgery, Rt THR,  OA    Patient Stated Goals less pain, to be able to garden, to be able to walk and stand longer; sleep thru the night currently waking up 1 x a night    Currently in Pain? Yes    Pain Score --   .5   Pain Location Leg    Pain Orientation Left    Pain Descriptors / Indicators Aching;Tingling    Pain Type Chronic pain    Pain Onset More than a  month ago                             [facility] Adult PT Treatment/Exercise - 12/15/20 0001      Lumbar Exercises: Standing   Other Standing Lumbar Exercises lateral lunge 2x 10 bilateral    Other Standing Lumbar Exercises Palof 2x 10 bilateral green band      Lumbar Exercises: Prone   Straight Leg Raise 10 reps;3 seconds    Straight Leg Raises Limitations 3 sets                  PT Education - 12/15/20 0835    Education Details HEP, exercise mechanics    Person(s) Educated Patient    Methods Explanation;Demonstration    Comprehension Verbalized understanding;Returned demonstration            PT Short Term Goals - 11/17/20 1154      PT SHORT TERM GOAL #1   Title Pt to be I in Hep to reduce nerve irritation to be able to state that his pain is not going below his  knee level    Time 3    Period Weeks    Status New    Target Date 12/08/20      PT SHORT TERM GOAL #2   Title Pt to be able to walk for 10 minutes without increased sx of pain.    Time 3    Period Weeks    Status New      PT SHORT TERM GOAL #3   Title PT lt gluteus medius to increase 1/2 grade to decrease pt limp    Time 3    Period Weeks    Status New      PT SHORT TERM GOAL #4   Title Pt to be able to sleep without waking from his leg pain.    Time 3    Period Weeks    Status New             PT Long Term Goals - 11/17/20 1206      PT LONG TERM GOAL #1   Title PT to be I in advance HEP to allow pt to have no radicular sx.    Time 6    Period Weeks    Status New    Target Date 12/29/20      PT LONG TERM GOAL #2   Title Pt Lt LE strength to be increased one grade to allow pt to walk without a limp.    Time 6    Period Weeks    Status New      PT LONG TERM GOAL #3   Title Pt to be able to single leg stance on both LE for 31 secondst for improved core strength and reduced risk of falling    Time 6    Period Weeks    Status New      PT LONG TERM GOAL #4   Title  Pt to be able to walk for 30 minutes without having increased pain    Time 6    Period Weeks    Status New      PT LONG TERM GOAL #5   Title PT to be working in his garden    Time [place] - 12/15/20 3086    Clinical Impression Statement Patient progressing well with PT intervention with continued minimal symptoms in LLE. Added prone hip extension for increased glute strength. Patient has difficulty with TRA activation with palof press initially but is given frequent cueing to hold. Patient stating minimal increase in LLE symptoms with palof which eases following completion. Patient requires verbal and tactile cueing for lateral lunges with good carry over. Patient will continue to benefit from skilled physical therapy in order to reduce impairment and improve function.    Personal Factors and Comorbidities Past/Current Experience;Time since onset of injury/illness/exacerbation    Examination-Activity Limitations Carry;Locomotion Level;Sleep;Stand;Stairs    Examination-Participation Restrictions Cleaning;Community Activity;Shop;Yard Work    [employer] officer Evolving/Moderate complexity    Rehab Potential Good    PT Frequency 2x / week    PT Duration 6 weeks    PT Treatment/Interventions Therapeutic exercise;Therapeutic activities;Patient/family education;Manual techniques    PT Next Visit Plan lumbar traction machine, self traction techniques, Core/TRA activation, breath work    PT Home Exercise Plan standing extension, sidelying abduction and prone hip extension;  5/10:  bridge and hs stretch (supine wiht hands behind knee), clam with  RTB around thigh 5/12 prone press ups, left lateral ship, fast paced walking; DF at wall 5/26 bridge with clam, piriformis stretch, lateral stepping    Consulted and Agree with Plan of Care Patient           Patient will benefit from skilled therapeutic intervention in order to improve the following  deficits and impairments:  Abnormal gait,Decreased activity tolerance,Decreased balance,Decreased mobility,Decreased range of motion,Difficulty walking,Decreased strength,Hypomobility,Postural dysfunction,Pain  Visit Diagnosis: Left lumbar radiculopathy  Difficulty in walking, not elsewhere classified     Problem List Patient Active Problem List   Diagnosis Date Noted  . Primary osteoarthritis of right hip 06/21/2016  . Hip joint replacement status 09/17/2015     9:16 AM, 12/15/20 Mearl Latin PT, DPT Physical Therapist at [facility] [place], [place], [address] Phone: [phone]   Fax:  [phone]  Name: BARY LIMBACH MRN: [id number] Date of Birth: 10-15-1950

## 2020-12-17 ENCOUNTER — Ambulatory Visit (HOSPITAL_COMMUNITY): Payer: Medicare Other | Attending: Rehabilitation | Admitting: Physical Therapy

## 2020-12-17 ENCOUNTER — Encounter (HOSPITAL_COMMUNITY): Payer: Self-pay | Admitting: Physical Therapy

## 2020-12-17 ENCOUNTER — Other Ambulatory Visit: Payer: Self-pay

## 2020-12-17 DIAGNOSIS — R262 Difficulty in walking, not elsewhere classified: Secondary | ICD-10-CM | POA: Diagnosis not present

## 2020-12-17 DIAGNOSIS — M5416 Radiculopathy, lumbar region: Secondary | ICD-10-CM | POA: Insufficient documentation

## 2020-12-17 NOTE — Patient Instructions (Addendum)
Access Code: [email or web address] URL: [email or web address] Date: 12/17/2020 Prepared by: [facility] Dvaughn Fickle  Exercises Isometric Dead Bug - 1 x daily - 7 x weekly - 10 reps - 5 second hold Access Code: [email or web address] URL: [email or web address] Date: 12/17/2020 Prepared by: Mitzi Hansen Tiamarie Furnari  Exercises Standing Anti-Rotation Press with Anchored Resistance - 1 x daily - 7 x weekly - 2 sets - 10 reps

## 2020-12-17 NOTE — Therapy (Signed)
[place], [place], [address] Phone: [phone]   Fax:  [phone]  Physical Therapy Treatment  Patient Details  Name: Thomas Hart MRN: [id number] Date of Birth: 25-Jan-1951 Referring Provider (PT): Zonia Kief   Encounter Date: 12/17/2020   PT End of Session - 12/17/20 0830    Visit Number 9    Number of Visits 12    Date for PT Re-Evaluation 12/29/20    Authorization Type Medicare/BCBS    Progress Note Due on Visit 10    PT Start Time 0830    PT Stop Time 0909    PT Time Calculation (min) 39 min    Activity Tolerance Patient tolerated treatment well;No increased pain    Behavior During Therapy WFL for tasks assessed/performed           Past Medical History:  Diagnosis Date  . Anxiety   . Hypertension   . Kidney stone   . Osteoarthritis    right hip  . Wears glasses     Past Surgical History:  Procedure Laterality Date  . LITHOTRIPSY    . ROTATOR CUFF REPAIR    . TOTAL HIP ARTHROPLASTY Right 09/17/2015   Procedure: RIGHT TOTAL HIP ARTHROPLASTY ANTERIOR APPROACH;  Surgeon: Leandrew Koyanagi, MD;  Location: [facility];  Service: Orthopedics;  Laterality: Right;  . WISDOM TOOTH EXTRACTION      There were no vitals filed for this visit.   Subjective Assessment - 12/17/20 0831    Subjective Patient states back is a little sore this morning. His but and legs are not hurting. He did mow yesterday so that why he thinks his back is sore. Leg feels funny sometimes not sure if numbness or weakness. Exercises are helping.    Pertinent History B rotator cuff surgery, Rt THR,  OA    Patient Stated Goals less pain, to be able to garden, to be able to walk and stand longer; sleep thru the night currently waking up 1 x a night    Currently in Pain? No/denies    Pain Onset More than a month ago                             [facility] Adult PT Treatment/Exercise - 12/17/20 0001      Lumbar Exercises:  Standing   Other Standing Lumbar Exercises lateral lunge 2x 10 bilateral    Other Standing Lumbar Exercises Palof 1x 10 bilateral green band      Lumbar Exercises: Supine   Other Supine Lumbar Exercises dead bug isometric with ball  2x 10x 5 second holds      Lumbar Exercises: Prone   Straight Leg Raise 10 reps;3 seconds    Straight Leg Raises Limitations 3 sets                  PT Education - 12/17/20 0831    Education Details HEP    Person(s) Educated Patient    Methods Explanation;Demonstration    Comprehension Verbalized understanding;Returned demonstration            PT Short Term Goals - 11/17/20 1154      PT SHORT TERM GOAL #1   Title Pt to be I in Hep to reduce nerve irritation to be able to state that his pain is not going below his knee level    Time 3    Period Weeks  Status New    Target Date 12/08/20      PT SHORT TERM GOAL #2   Title Pt to be able to walk for 10 minutes without increased sx of pain.    Time 3    Period Weeks    Status New      PT SHORT TERM GOAL #3   Title PT lt gluteus medius to increase 1/2 grade to decrease pt limp    Time 3    Period Weeks    Status New      PT SHORT TERM GOAL #4   Title Pt to be able to sleep without waking from his leg pain.    Time 3    Period Weeks    Status New             PT Long Term Goals - 11/17/20 1206      PT LONG TERM GOAL #1   Title PT to be I in advance HEP to allow pt to have no radicular sx.    Time 6    Period Weeks    Status New    Target Date 12/29/20      PT LONG TERM GOAL #2   Title Pt Lt LE strength to be increased one grade to allow pt to walk without a limp.    Time 6    Period Weeks    Status New      PT LONG TERM GOAL #3   Title Pt to be able to single leg stance on both LE for 66 secondst for improved core strength and reduced risk of falling    Time 6    Period Weeks    Status New      PT LONG TERM GOAL #4   Title Pt to be able to walk for 30 minutes  without having increased pain    Time 6    Period Weeks    Status New      PT LONG TERM GOAL #5   Title PT to be working in his garden    Time 6    Period Weeks                 Plan - 12/17/20 0831    Clinical Impression Statement Patient progressing well with therapy and states centralizing symptoms. Discussed POC with patient and he is eager to continue with PT to make sure symptoms continue to improve and not return.  Patient with good mechanics with previously performed exercises. Patient tolerates additional of dead bug isometric for core strength and is given cueing for maintaining TRA activation. Patient will continue to benefit from skilled physical therapy in order to reduce impairment and improve function.    Personal Factors and Comorbidities Past/Current Experience;Time since onset of injury/illness/exacerbation    Examination-Activity Limitations Carry;Locomotion Level;Sleep;Stand;Stairs    Examination-Participation Restrictions Cleaning;Community Activity;Shop;Yard Work    [employer] officer Evolving/Moderate complexity    Rehab Potential Good    PT Frequency 2x / week    PT Duration 6 weeks    PT Treatment/Interventions Therapeutic exercise;Therapeutic activities;Patient/family education;Manual techniques    PT Next Visit Plan lumbar traction machine, self traction techniques, Core/TRA activation, breath work    PT Home Exercise Plan standing extension, sidelying abduction and prone hip extension;  5/10:  bridge and hs stretch (supine wiht hands behind knee), clam with RTB around thigh 5/12 prone press ups, left lateral ship, fast paced walking; DF at wall 5/26 bridge with clam,  piriformis stretch, lateral stepping 01-06-23 dead bug iso    Consulted and Agree with Plan of Care Patient           Patient will benefit from skilled therapeutic intervention in order to improve the following deficits and impairments:  Abnormal gait,Decreased activity  tolerance,Decreased balance,Decreased mobility,Decreased range of motion,Difficulty walking,Decreased strength,Hypomobility,Postural dysfunction,Pain  Visit Diagnosis: Left lumbar radiculopathy  Difficulty in walking, not elsewhere classified     Problem List Patient Active Problem List   Diagnosis Date Noted  . Primary osteoarthritis of right hip 06/21/2016  . Hip joint replacement status 09/17/2015    9:11 AM, 01/05/2021 Mearl Latin PT, DPT Physical Therapist at [facility] [place], [place], [address] Phone: [phone]   Fax:  [phone]  Name: Thomas [facility] MRN: [id number] Date of Birth: 08-27-1950

## 2020-12-23 ENCOUNTER — Ambulatory Visit (HOSPITAL_COMMUNITY): Payer: Medicare Other | Admitting: Physical Therapy

## 2020-12-23 ENCOUNTER — Other Ambulatory Visit: Payer: Self-pay

## 2020-12-23 ENCOUNTER — Encounter (HOSPITAL_COMMUNITY): Payer: Self-pay | Admitting: Physical Therapy

## 2020-12-23 DIAGNOSIS — M5416 Radiculopathy, lumbar region: Secondary | ICD-10-CM | POA: Diagnosis not present

## 2020-12-23 DIAGNOSIS — R262 Difficulty in walking, not elsewhere classified: Secondary | ICD-10-CM

## 2020-12-23 NOTE — Therapy (Signed)
[facility] [address] [place], [place], [address] Phone: [phone]   Fax:  [phone]  Physical Therapy Treatment  And Discharge Note  Patient Details  Name: Thomas Hart MRN: [id number] Date of Birth: 09/27/1950 Referring Provider (PT): Zonia Kief  PHYSICAL THERAPY DISCHARGE SUMMARY  Visits from Start of Care: 10  Current functional level related to goals / functional outcomes: All but balance goal met    Remaining deficits: strength   Education / Equipment: See below Plan: Patient agrees to discharge.  Patient goals were partially met. Patient is being discharged due to meeting the stated rehab goals.  ?????        Encounter Date: 12/23/2020   PT End of Session - 12/23/20 0746    Visit Number 10    Number of Visits 12    Date for PT Re-Evaluation 12/29/20    Authorization Type Medicare/BCBS    Progress Note Due on Visit 20    PT Start Time 0746    PT Stop Time 0820    PT Time Calculation (min) 34 min    Activity Tolerance Patient tolerated treatment well;No increased pain    Behavior During Therapy WFL for tasks assessed/performed           Past Medical History:  Diagnosis Date  . Anxiety   . Hypertension   . Kidney stone   . Osteoarthritis    right hip  . Wears glasses     Past Surgical History:  Procedure Laterality Date  . LITHOTRIPSY    . ROTATOR CUFF REPAIR    . TOTAL HIP ARTHROPLASTY Right 09/17/2015   Procedure: RIGHT TOTAL HIP ARTHROPLASTY ANTERIOR APPROACH;  Surgeon: Leandrew Koyanagi, MD;  Location: [facility];  Service: Orthopedics;  Laterality: Right;  . WISDOM TOOTH EXTRACTION      There were no vitals filed for this visit.   Subjective Assessment - 12/23/20 0750    Subjective States no pain and has been doing outdoor stuff. States he even rode his bike they other day. States overall he feels about 100% better in regards to his pain but his left leg is still a little weak and has been  concentrating on his leg lifts and walking.    Pertinent History B rotator cuff surgery, Rt THR,  OA    Patient Stated Goals less pain, to be able to garden, to be able to walk and stand longer; sleep thru the night currently waking up 1 x a night    Currently in Pain? No/denies    Pain Onset More than a month ago              [facility] PT Assessment - 12/23/20 0001      Assessment   Medical Diagnosis lumbar radiculopathy    Referring Provider (PT) Zonia Kief      Observation/Other Assessments   Focus on Therapeutic Outcomes (FOTO)  71% function   was 39% function     Single Leg Stance   Comments rt 20 seconds left 15      Strength   Right Hip Flexion 5/5    Right Hip Extension 5/5    Left Hip Flexion 5/5    Left Hip Extension 4+/5    Right Knee Flexion 5/5    Right Knee Extension 5/5    Left Knee Flexion 5/5    Left Knee Extension 5/5  PT Education - 12/23/20 0815    Education Details on posture, on HEP, on POC and on substitutions for right shoulder, answered all questions    Person(s) Educated Patient    Methods Explanation    Comprehension Verbalized understanding            PT Short Term Goals - 12/23/20 0753      PT SHORT TERM GOAL #1   Title Pt to be I in Hep to reduce nerve irritation to be able to state that his pain is not going below his knee level    Time 3    Period Weeks    Status Achieved    Target Date 12/08/20      PT SHORT TERM GOAL #2   Title Pt to be able to walk for 10 minutes without increased sx of pain.    Time 3    Period Weeks    Status Achieved      PT SHORT TERM GOAL #3   Title PT lt gluteus medius to increase 1/2 grade to decrease pt limp    Time 3    Period Weeks    Status New      PT SHORT TERM GOAL #4   Title Pt to be able to sleep without waking from his leg pain.    Time 3    Period Weeks    Status Achieved             PT Long Term Goals - 12/23/20 0755       PT LONG TERM GOAL #1   Title PT to be I in advance HEP to allow pt to have no radicular sx.    Time 6    Period Weeks    Status Achieved      PT LONG TERM GOAL #2   Title Pt Lt LE strength to be increased one grade to allow pt to walk without a limp.    Time 6    Period Weeks    Status Achieved      PT LONG TERM GOAL #3   Title Pt to be able to single leg stance on both LE for 19 secondst for improved core strength and reduced risk of falling    Time 6    Period Weeks    Status On-going      PT LONG TERM GOAL #4   Title Pt to be able to walk for 30 minutes without having increased pain    Baseline no pain with walking    Time 6    Period Weeks    Status Achieved      PT LONG TERM GOAL #5   Title PT to be working in his garden    Time 6    Period Weeks    Status Achieved                 Plan - 12/23/20 0746    Clinical Impression Statement All but SLS goal met at this time. Reviewed HEP and answered all questions. Discussed difference between mobility and strength and how to organize his exercises as well as how much of what he should be doing daily. Patient to discharge from PT to HEP secondary to progress made    Personal Factors and Comorbidities Past/Current Experience;Time since onset of injury/illness/exacerbation    Examination-Activity Limitations Carry;Locomotion Level;Sleep;Stand;Stairs    Examination-Participation Restrictions Cleaning;Community Activity;Shop;Yard Work    Advice worker  Good    PT Frequency 2x / week    PT Duration 6 weeks    PT Treatment/Interventions Therapeutic exercise;Therapeutic activities;Patient/family education;Manual techniques    PT Next Visit Plan DC to HEP    PT Home Exercise Plan standing extension, sidelying abduction and prone hip extension;  5/10:  bridge and hs stretch (supine wiht hands behind knee), clam with RTB around thigh 5/12 prone press ups,  left lateral ship, fast paced walking; DF at wall 5/26 bridge with clam, piriformis stretch, lateral stepping 01-02-2023 dead bug iso    Consulted and Agree with Plan of Care Patient           Patient will benefit from skilled therapeutic intervention in order to improve the following deficits and impairments:  Abnormal gait,Decreased activity tolerance,Decreased balance,Decreased mobility,Decreased range of motion,Difficulty walking,Decreased strength,Hypomobility,Postural dysfunction,Pain  Visit Diagnosis: Left lumbar radiculopathy  Difficulty in walking, not elsewhere classified     Problem List Patient Active Problem List   Diagnosis Date Noted  . Primary osteoarthritis of right hip 06/21/2016  . Hip joint replacement status 09/17/2015    8:26 AM, 12/23/20 Jerene Pitch, DPT Physical Therapy with [facility]  [phone] office  [facility] [address] [place], [place], [address] Phone: [phone]   Fax:  [phone]  Name: Thomas Hart MRN: [id number] Date of Birth: Aug 19, 1950

## 2020-12-25 ENCOUNTER — Encounter (HOSPITAL_COMMUNITY): Payer: Medicare Other

## 2020-12-29 ENCOUNTER — Encounter (HOSPITAL_COMMUNITY): Payer: Medicare Other | Admitting: Physical Therapy

## 2021-06-08 DIAGNOSIS — Z23 Encounter for immunization: Secondary | ICD-10-CM | POA: Diagnosis not present

## 2021-08-15 DIAGNOSIS — I10 Essential (primary) hypertension: Secondary | ICD-10-CM | POA: Diagnosis not present

## 2021-08-15 DIAGNOSIS — E785 Hyperlipidemia, unspecified: Secondary | ICD-10-CM | POA: Diagnosis not present

## 2021-12-08 DIAGNOSIS — M5416 Radiculopathy, lumbar region: Secondary | ICD-10-CM | POA: Diagnosis not present

## 2021-12-08 DIAGNOSIS — M545 Low back pain, unspecified: Secondary | ICD-10-CM | POA: Diagnosis not present

## 2021-12-28 DIAGNOSIS — I10 Essential (primary) hypertension: Secondary | ICD-10-CM | POA: Diagnosis not present

## 2021-12-28 DIAGNOSIS — F419 Anxiety disorder, unspecified: Secondary | ICD-10-CM | POA: Diagnosis not present

## 2021-12-28 DIAGNOSIS — Z79899 Other long term (current) drug therapy: Secondary | ICD-10-CM | POA: Diagnosis not present

## 2021-12-28 DIAGNOSIS — Z125 Encounter for screening for malignant neoplasm of prostate: Secondary | ICD-10-CM | POA: Diagnosis not present

## 2021-12-28 DIAGNOSIS — R7301 Impaired fasting glucose: Secondary | ICD-10-CM | POA: Diagnosis not present

## 2021-12-29 DIAGNOSIS — M5416 Radiculopathy, lumbar region: Secondary | ICD-10-CM | POA: Diagnosis not present

## 2022-01-04 DIAGNOSIS — I10 Essential (primary) hypertension: Secondary | ICD-10-CM | POA: Diagnosis not present

## 2022-01-04 DIAGNOSIS — R7309 Other abnormal glucose: Secondary | ICD-10-CM | POA: Diagnosis not present

## 2022-01-04 DIAGNOSIS — M47816 Spondylosis without myelopathy or radiculopathy, lumbar region: Secondary | ICD-10-CM | POA: Diagnosis not present

## 2022-01-04 DIAGNOSIS — Z0000 Encounter for general adult medical examination without abnormal findings: Secondary | ICD-10-CM | POA: Diagnosis not present

## 2022-01-04 DIAGNOSIS — F411 Generalized anxiety disorder: Secondary | ICD-10-CM | POA: Diagnosis not present

## 2022-01-04 DIAGNOSIS — Z23 Encounter for immunization: Secondary | ICD-10-CM | POA: Diagnosis not present

## 2022-01-04 DIAGNOSIS — E785 Hyperlipidemia, unspecified: Secondary | ICD-10-CM | POA: Diagnosis not present

## 2022-01-12 DIAGNOSIS — M5416 Radiculopathy, lumbar region: Secondary | ICD-10-CM | POA: Diagnosis not present

## 2022-01-13 ENCOUNTER — Other Ambulatory Visit (HOSPITAL_COMMUNITY): Payer: Self-pay | Admitting: Rehabilitation

## 2022-01-13 ENCOUNTER — Other Ambulatory Visit: Payer: Self-pay | Admitting: Rehabilitation

## 2022-01-13 DIAGNOSIS — M5416 Radiculopathy, lumbar region: Secondary | ICD-10-CM

## 2022-01-13 DIAGNOSIS — M4716 Other spondylosis with myelopathy, lumbar region: Secondary | ICD-10-CM

## 2022-01-28 ENCOUNTER — Ambulatory Visit (HOSPITAL_COMMUNITY)
Admission: RE | Admit: 2022-01-28 | Discharge: 2022-01-28 | Disposition: A | Discharge status: Home / Self Care | Payer: Medicare Other | Source: Ambulatory Visit | Attending: Rehabilitation | Admitting: Rehabilitation

## 2022-01-28 DIAGNOSIS — M4716 Other spondylosis with myelopathy, lumbar region: Secondary | ICD-10-CM

## 2022-01-28 DIAGNOSIS — M5416 Radiculopathy, lumbar region: Secondary | ICD-10-CM

## 2022-01-28 DIAGNOSIS — M5126 Other intervertebral disc displacement, lumbar region: Secondary | ICD-10-CM | POA: Diagnosis not present

## 2022-01-28 DIAGNOSIS — M48061 Spinal stenosis, lumbar region without neurogenic claudication: Secondary | ICD-10-CM | POA: Diagnosis not present

## 2022-02-07 DIAGNOSIS — M5416 Radiculopathy, lumbar region: Secondary | ICD-10-CM | POA: Diagnosis not present

## 2022-02-23 DIAGNOSIS — M25532 Pain in left wrist: Secondary | ICD-10-CM | POA: Diagnosis not present

## 2022-02-23 DIAGNOSIS — M542 Cervicalgia: Secondary | ICD-10-CM | POA: Diagnosis not present

## 2022-02-23 DIAGNOSIS — M25531 Pain in right wrist: Secondary | ICD-10-CM | POA: Diagnosis not present

## 2022-03-02 DIAGNOSIS — R2681 Unsteadiness on feet: Secondary | ICD-10-CM | POA: Diagnosis not present

## 2022-03-02 DIAGNOSIS — M6281 Muscle weakness (generalized): Secondary | ICD-10-CM | POA: Diagnosis not present

## 2022-03-02 DIAGNOSIS — R293 Abnormal posture: Secondary | ICD-10-CM | POA: Diagnosis not present

## 2022-03-02 DIAGNOSIS — M542 Cervicalgia: Secondary | ICD-10-CM | POA: Diagnosis not present

## 2022-03-08 DIAGNOSIS — R293 Abnormal posture: Secondary | ICD-10-CM | POA: Diagnosis not present

## 2022-03-08 DIAGNOSIS — R2681 Unsteadiness on feet: Secondary | ICD-10-CM | POA: Diagnosis not present

## 2022-03-08 DIAGNOSIS — M6281 Muscle weakness (generalized): Secondary | ICD-10-CM | POA: Diagnosis not present

## 2022-03-08 DIAGNOSIS — M542 Cervicalgia: Secondary | ICD-10-CM | POA: Diagnosis not present

## 2022-03-10 DIAGNOSIS — M542 Cervicalgia: Secondary | ICD-10-CM | POA: Diagnosis not present

## 2022-03-10 DIAGNOSIS — M6281 Muscle weakness (generalized): Secondary | ICD-10-CM | POA: Diagnosis not present

## 2022-03-10 DIAGNOSIS — R293 Abnormal posture: Secondary | ICD-10-CM | POA: Diagnosis not present

## 2022-03-10 DIAGNOSIS — R2681 Unsteadiness on feet: Secondary | ICD-10-CM | POA: Diagnosis not present

## 2022-03-15 DIAGNOSIS — R293 Abnormal posture: Secondary | ICD-10-CM | POA: Diagnosis not present

## 2022-03-15 DIAGNOSIS — M6281 Muscle weakness (generalized): Secondary | ICD-10-CM | POA: Diagnosis not present

## 2022-03-15 DIAGNOSIS — M542 Cervicalgia: Secondary | ICD-10-CM | POA: Diagnosis not present

## 2022-03-15 DIAGNOSIS — R2681 Unsteadiness on feet: Secondary | ICD-10-CM | POA: Diagnosis not present

## 2022-03-17 DIAGNOSIS — R2681 Unsteadiness on feet: Secondary | ICD-10-CM | POA: Diagnosis not present

## 2022-03-17 DIAGNOSIS — M542 Cervicalgia: Secondary | ICD-10-CM | POA: Diagnosis not present

## 2022-03-17 DIAGNOSIS — R293 Abnormal posture: Secondary | ICD-10-CM | POA: Diagnosis not present

## 2022-03-17 DIAGNOSIS — M6281 Muscle weakness (generalized): Secondary | ICD-10-CM | POA: Diagnosis not present

## 2022-03-22 DIAGNOSIS — M6281 Muscle weakness (generalized): Secondary | ICD-10-CM | POA: Diagnosis not present

## 2022-03-22 DIAGNOSIS — R2681 Unsteadiness on feet: Secondary | ICD-10-CM | POA: Diagnosis not present

## 2022-03-22 DIAGNOSIS — M542 Cervicalgia: Secondary | ICD-10-CM | POA: Diagnosis not present

## 2022-03-22 DIAGNOSIS — R293 Abnormal posture: Secondary | ICD-10-CM | POA: Diagnosis not present

## 2022-03-24 DIAGNOSIS — R293 Abnormal posture: Secondary | ICD-10-CM | POA: Diagnosis not present

## 2022-03-24 DIAGNOSIS — R2681 Unsteadiness on feet: Secondary | ICD-10-CM | POA: Diagnosis not present

## 2022-03-24 DIAGNOSIS — M6281 Muscle weakness (generalized): Secondary | ICD-10-CM | POA: Diagnosis not present

## 2022-03-24 DIAGNOSIS — M542 Cervicalgia: Secondary | ICD-10-CM | POA: Diagnosis not present

## 2022-03-29 DIAGNOSIS — M542 Cervicalgia: Secondary | ICD-10-CM | POA: Diagnosis not present

## 2022-03-29 DIAGNOSIS — R2681 Unsteadiness on feet: Secondary | ICD-10-CM | POA: Diagnosis not present

## 2022-03-29 DIAGNOSIS — M6281 Muscle weakness (generalized): Secondary | ICD-10-CM | POA: Diagnosis not present

## 2022-03-29 DIAGNOSIS — R293 Abnormal posture: Secondary | ICD-10-CM | POA: Diagnosis not present

## 2022-04-06 DIAGNOSIS — M25531 Pain in right wrist: Secondary | ICD-10-CM | POA: Diagnosis not present

## 2022-06-13 DIAGNOSIS — M542 Cervicalgia: Secondary | ICD-10-CM | POA: Diagnosis not present

## 2022-06-13 DIAGNOSIS — M25532 Pain in left wrist: Secondary | ICD-10-CM | POA: Diagnosis not present

## 2022-06-13 DIAGNOSIS — M25531 Pain in right wrist: Secondary | ICD-10-CM | POA: Diagnosis not present

## 2022-07-05 DIAGNOSIS — F419 Anxiety disorder, unspecified: Secondary | ICD-10-CM | POA: Diagnosis not present

## 2022-07-05 DIAGNOSIS — I10 Essential (primary) hypertension: Secondary | ICD-10-CM | POA: Diagnosis not present

## 2022-07-26 DIAGNOSIS — Z23 Encounter for immunization: Secondary | ICD-10-CM | POA: Diagnosis not present

## 2022-09-05 IMAGING — MR MR LUMBAR SPINE W/O CM
5 series · 31 of 48 positions shown · non-contrast
Comparison: None.

CLINICAL DATA: Low back pain.

EXAM:
MRI LUMBAR SPINE WITHOUT CONTRAST
TECHNIQUE: Multiplanar, multisequence MR imaging of the lumbar spine was
performed. No intravenous contrast was administered.

[Series 5: T2 · sagittal · 4.0mm · 0.68mm/px · 8 of 17 slices shown (1 of 2)]
[im 1/17]
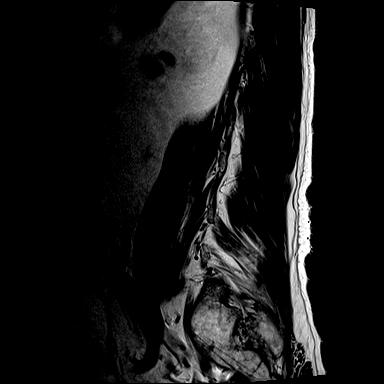
[im 3/17]
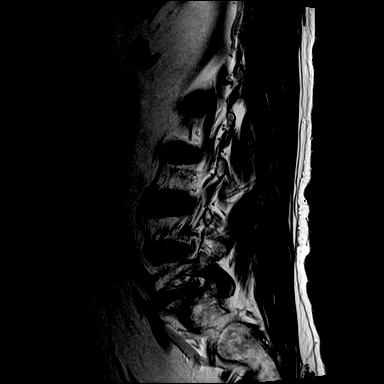
[im 5/17]
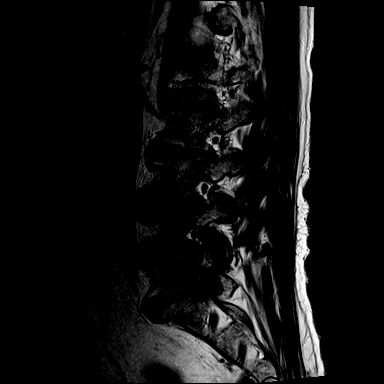
[im 7/17]
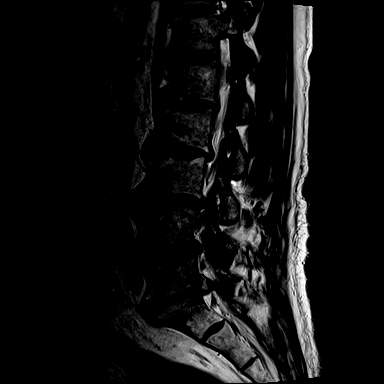
[im 10/17]
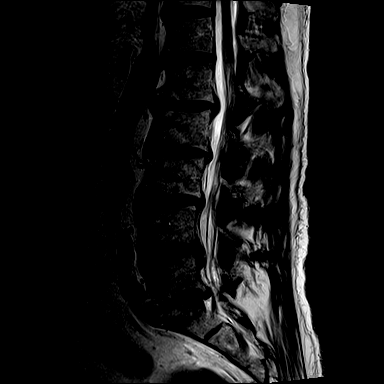
[im 12/17]
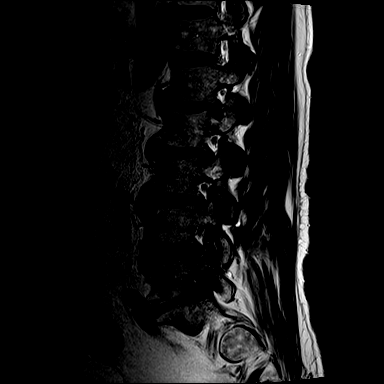
[im 14/17]
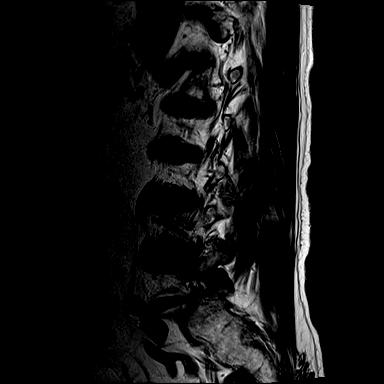
[im 17/17]
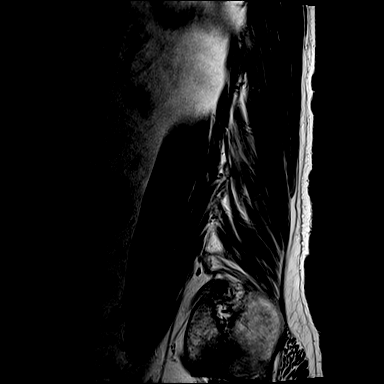

[Series 6: T1 · sagittal · 4.0mm · 0.81mm/px · 6 of 15 slices shown (1 of 2)]
[im 1/15]
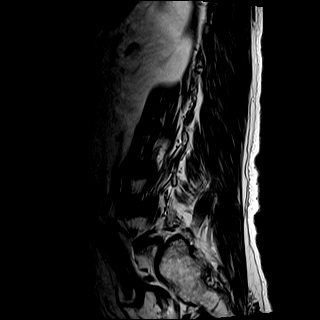
[im 3/15]
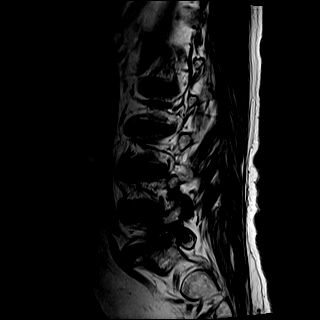
[im 6/15]
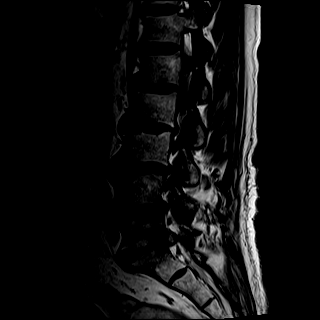
[im 9/15]
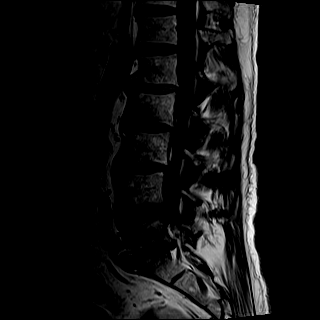
[im 12/15]
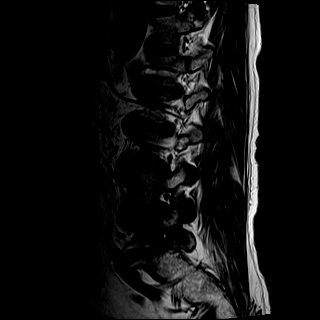
[im 15/15]
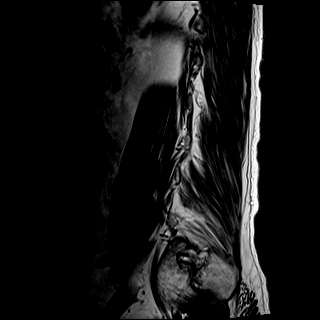

[Series 7: STIR · sagittal · 4.0mm · 0.51mm/px · 1 of 15 slices shown]
[im 1/15]
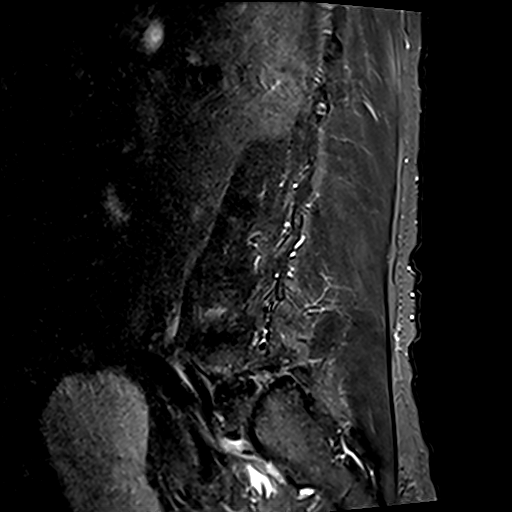

[Series 8: T2 · axial · 4.0mm · 0.70mm/px · z∈[-94,+94]mm · 8 of 33 slices shown (2 of 2)]
[im 1/33]
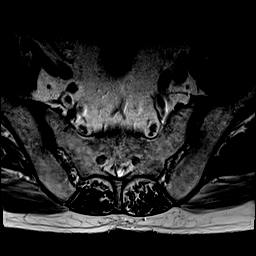
[im 5/33]
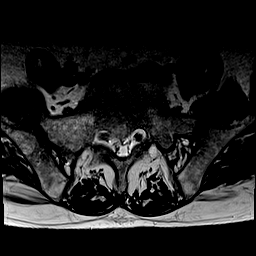
[im 10/33]
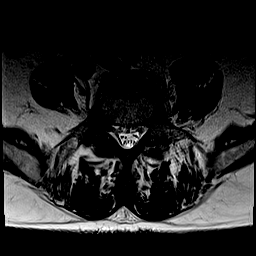
[im 15/33]
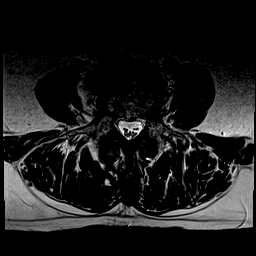
[im 18/33]
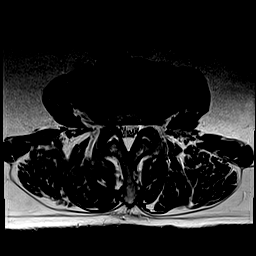
[im 23/33]
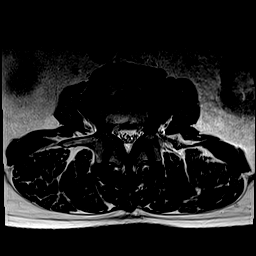
[im 28/33]
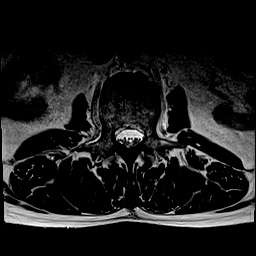
[im 33/33]
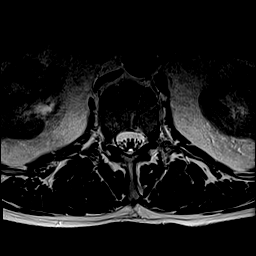

[Series 9: T1 · axial · 4.0mm · 0.35mm/px · z∈[-94,+94]mm · 8 of 33 slices shown (2 of 2)]
[im 1/33]
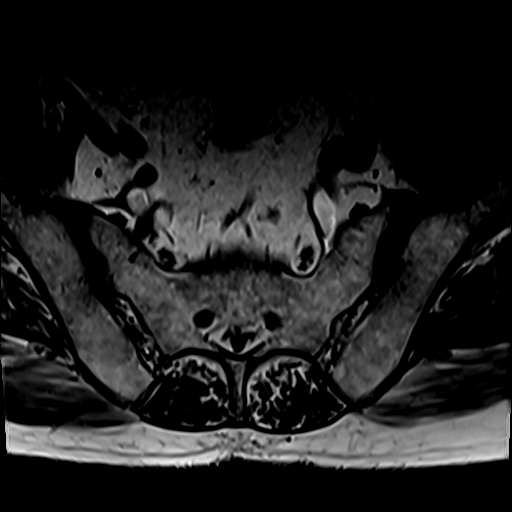
[im 5/33]
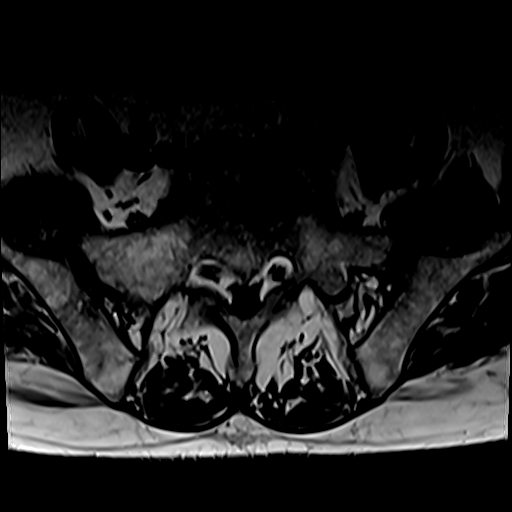
[im 10/33]
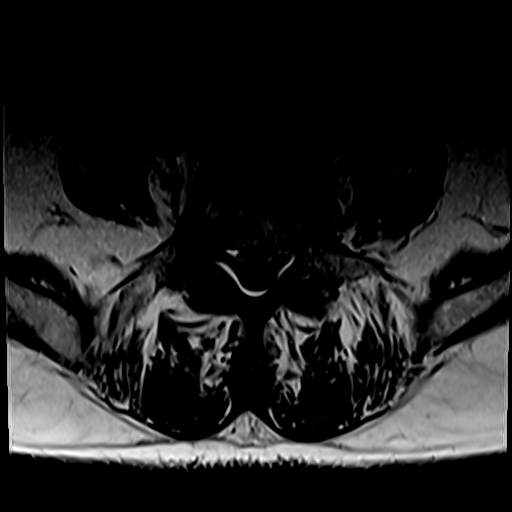
[im 15/33]
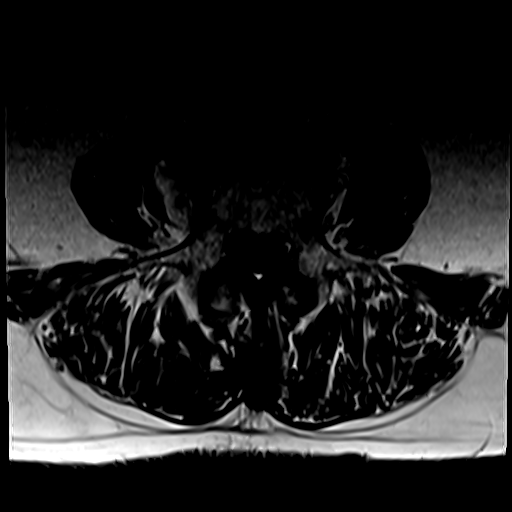
[im 18/33]
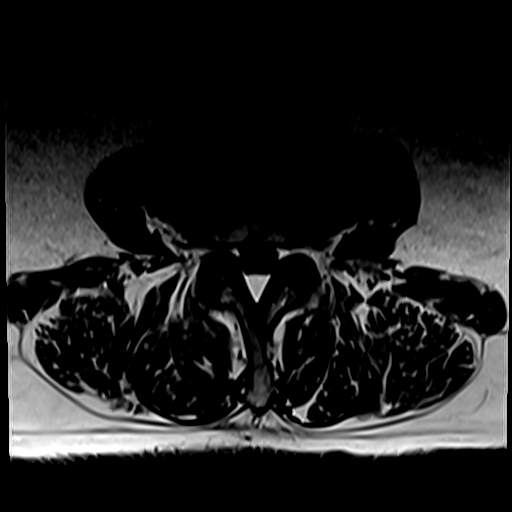
[im 23/33]
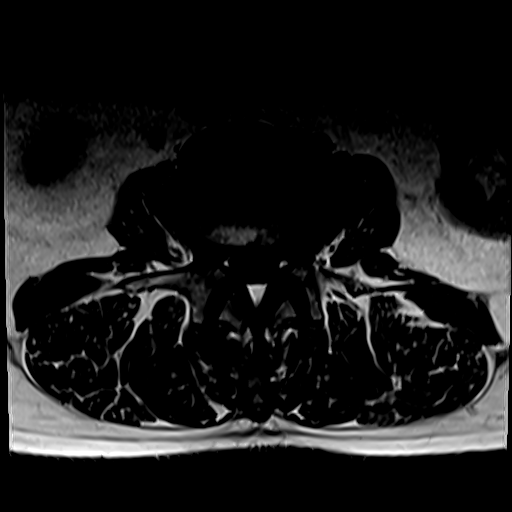
[im 28/33]
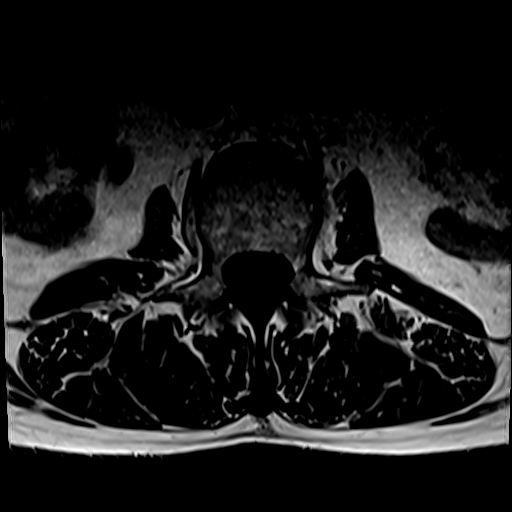
[im 33/33]
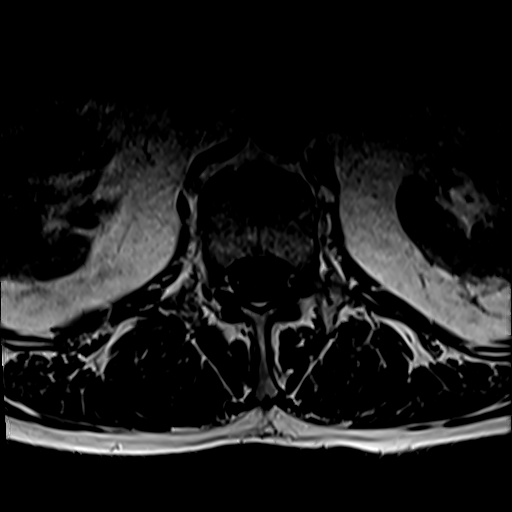

[31 of 48 positions shown; findings below may reference images not displayed]

FINDINGS: Segmentation:  Standard.

Alignment: Small retrolisthesis at throughout the lumbar spine, more
pronounced at L5-S1.

Vertebrae: No fracture, evidence of discitis, or bone
lesion.Endplate degenerative changes at L2-3, L3-4 and L5-S1.

Conus medullaris and cauda equina: Conus extends to the L1-2 level.
Conus and cauda equina appear normal. Synovial thickening at the
left sacroiliac joint.

Paraspinal and other soft tissues: Negative.

Disc levels:

T12-L1: No spinal canal or neural foraminal stenosis.

L1-2: Loss of disc height, disc bulge and mild facet degenerative
changes resulting in mild bilateral neural foraminal narrowing, left
greater than right.

L2-3: Loss of disc height, disc bulge and mild facet degenerative
changes resulting in mild bilateral neural foraminal narrowing. No
significant spinal canal stenosis.

L3-4: Mild loss of disc height, disc bulge mild facet degenerative
changes resulting in narrowing of the right subarticular zone and
mild bilateral neural foraminal narrowing, right greater than left.

L4-5: Disc bulge with superimposed left central/subarticular disc
extrusion migrating inferiorly, mild-to-moderate facet degenerative
changes and prominence of the epidural fat. Findings result in
mild-to-moderate narrowing of the thecal sac, narrowing of the left
subarticular zone, moderate right and mild left neural foraminal
narrowing.

L5-S1: Loss of disc height, disc bulge with associated osteophytic
component which is asymmetric to the right, moderate to advanced
facet degenerative changes. Findings result in narrowing of the
bilateral subarticular zones and severe bilateral neural foraminal
narrowing. No significant spinal canal stenosis.
IMPRESSION: 1. Multilevel degenerative changes of the lumbar spine as described
above, worst at L4-5 where there is mild-to-moderate narrowing of
the thecal sac, narrowing of the left subarticular zone and moderate
right neural foraminal narrowing.
2. Severe bilateral neural foraminal narrowing at L5-S1.

## 2022-09-12 DIAGNOSIS — D225 Melanocytic nevi of trunk: Secondary | ICD-10-CM | POA: Diagnosis not present

## 2022-09-12 DIAGNOSIS — D485 Neoplasm of uncertain behavior of skin: Secondary | ICD-10-CM | POA: Diagnosis not present

## 2022-09-12 DIAGNOSIS — Z1283 Encounter for screening for malignant neoplasm of skin: Secondary | ICD-10-CM | POA: Diagnosis not present

## 2022-09-12 DIAGNOSIS — L989 Disorder of the skin and subcutaneous tissue, unspecified: Secondary | ICD-10-CM | POA: Diagnosis not present

## 2022-10-06 DIAGNOSIS — D225 Melanocytic nevi of trunk: Secondary | ICD-10-CM | POA: Diagnosis not present

## 2022-10-06 DIAGNOSIS — D485 Neoplasm of uncertain behavior of skin: Secondary | ICD-10-CM | POA: Diagnosis not present

## 2022-10-06 DIAGNOSIS — D487 Neoplasm of uncertain behavior of other specified sites: Secondary | ICD-10-CM | POA: Diagnosis not present

## 2022-11-10 DIAGNOSIS — L570 Actinic keratosis: Secondary | ICD-10-CM | POA: Diagnosis not present

## 2022-11-10 DIAGNOSIS — D2271 Melanocytic nevi of right lower limb, including hip: Secondary | ICD-10-CM | POA: Diagnosis not present

## 2022-11-10 DIAGNOSIS — D485 Neoplasm of uncertain behavior of skin: Secondary | ICD-10-CM | POA: Diagnosis not present

## 2022-11-10 DIAGNOSIS — D225 Melanocytic nevi of trunk: Secondary | ICD-10-CM | POA: Diagnosis not present

## 2022-11-10 DIAGNOSIS — X32XXXD Exposure to sunlight, subsequent encounter: Secondary | ICD-10-CM | POA: Diagnosis not present

## 2023-01-02 DIAGNOSIS — I10 Essential (primary) hypertension: Secondary | ICD-10-CM | POA: Diagnosis not present

## 2023-01-02 DIAGNOSIS — Z79899 Other long term (current) drug therapy: Secondary | ICD-10-CM | POA: Diagnosis not present

## 2023-01-02 DIAGNOSIS — E785 Hyperlipidemia, unspecified: Secondary | ICD-10-CM | POA: Diagnosis not present

## 2023-01-02 DIAGNOSIS — Z125 Encounter for screening for malignant neoplasm of prostate: Secondary | ICD-10-CM | POA: Diagnosis not present

## 2023-01-02 DIAGNOSIS — F419 Anxiety disorder, unspecified: Secondary | ICD-10-CM | POA: Diagnosis not present

## 2023-01-02 DIAGNOSIS — R7301 Impaired fasting glucose: Secondary | ICD-10-CM | POA: Diagnosis not present

## 2023-01-09 DIAGNOSIS — Z0000 Encounter for general adult medical examination without abnormal findings: Secondary | ICD-10-CM | POA: Diagnosis not present

## 2023-01-09 DIAGNOSIS — I10 Essential (primary) hypertension: Secondary | ICD-10-CM | POA: Diagnosis not present

## 2023-01-09 DIAGNOSIS — E785 Hyperlipidemia, unspecified: Secondary | ICD-10-CM | POA: Diagnosis not present

## 2023-01-09 DIAGNOSIS — R7309 Other abnormal glucose: Secondary | ICD-10-CM | POA: Diagnosis not present

## 2023-02-06 DIAGNOSIS — H43821 Vitreomacular adhesion, right eye: Secondary | ICD-10-CM | POA: Diagnosis not present

## 2023-02-06 DIAGNOSIS — H524 Presbyopia: Secondary | ICD-10-CM | POA: Diagnosis not present

## 2023-03-31 ENCOUNTER — Emergency Department (HOSPITAL_COMMUNITY): Payer: Medicare Other

## 2023-03-31 ENCOUNTER — Encounter (HOSPITAL_COMMUNITY): Payer: Self-pay

## 2023-03-31 ENCOUNTER — Emergency Department (HOSPITAL_COMMUNITY)
Admission: EM | Admit: 2023-03-31 | Discharge: 2023-03-31 | Disposition: A | Discharge status: Home / Self Care | Payer: Medicare Other | Attending: Emergency Medicine | Admitting: Emergency Medicine

## 2023-03-31 ENCOUNTER — Other Ambulatory Visit: Payer: Self-pay

## 2023-03-31 DIAGNOSIS — R7989 Other specified abnormal findings of blood chemistry: Secondary | ICD-10-CM | POA: Diagnosis not present

## 2023-03-31 DIAGNOSIS — Z7982 Long term (current) use of aspirin: Secondary | ICD-10-CM | POA: Diagnosis not present

## 2023-03-31 DIAGNOSIS — R748 Abnormal levels of other serum enzymes: Secondary | ICD-10-CM | POA: Diagnosis not present

## 2023-03-31 DIAGNOSIS — Z1152 Encounter for screening for COVID-19: Secondary | ICD-10-CM | POA: Insufficient documentation

## 2023-03-31 DIAGNOSIS — R509 Fever, unspecified: Secondary | ICD-10-CM | POA: Insufficient documentation

## 2023-03-31 DIAGNOSIS — R531 Weakness: Secondary | ICD-10-CM | POA: Diagnosis not present

## 2023-03-31 DIAGNOSIS — R109 Unspecified abdominal pain: Secondary | ICD-10-CM | POA: Diagnosis not present

## 2023-03-31 DIAGNOSIS — R197 Diarrhea, unspecified: Secondary | ICD-10-CM | POA: Insufficient documentation

## 2023-03-31 LAB — COMPREHENSIVE METABOLIC PANEL
ALT: 84 U/L — ABNORMAL HIGH (ref 0–44)
AST: 84 U/L — ABNORMAL HIGH (ref 15–41)
Albumin: 3.8 g/dL (ref 3.5–5.0)
Alkaline Phosphatase: 66 U/L (ref 38–126)
Anion gap: 12 (ref 5–15)
BUN: 25 mg/dL — ABNORMAL HIGH (ref 8–23)
CO2: 26 mmol/L (ref 22–32)
Calcium: 8.4 mg/dL — ABNORMAL LOW (ref 8.9–10.3)
Chloride: 94 mmol/L — ABNORMAL LOW (ref 98–111)
Creatinine, Ser: 1.29 mg/dL — ABNORMAL HIGH (ref 0.61–1.24)
GFR, Estimated: 60 mL/min — ABNORMAL LOW (ref >60)
Glucose, Bld: 118 mg/dL — ABNORMAL HIGH (ref 70–99)
Potassium: 3.5 mmol/L (ref 3.5–5.1)
Sodium: 132 mmol/L — ABNORMAL LOW (ref 135–145)
Total Bilirubin: 0.8 mg/dL (ref 0.3–1.2)
Total Protein: 7.1 g/dL (ref 6.5–8.1)

## 2023-03-31 LAB — LIPASE, BLOOD: Lipase: 69 U/L — ABNORMAL HIGH (ref 11–51)

## 2023-03-31 LAB — CBC
HCT: 43.7 % (ref 39.0–52.0)
Hemoglobin: 15.4 g/dL (ref 13.0–17.0)
MCH: 33.1 pg (ref 26.0–34.0)
MCHC: 35.2 g/dL (ref 30.0–36.0)
MCV: 94 fL (ref 80.0–100.0)
Platelets: 81 10*3/uL — ABNORMAL LOW (ref 150–400)
RBC: 4.65 MIL/uL (ref 4.22–5.81)
RDW: 11.8 % (ref 11.5–15.5)
WBC: 4.2 10*3/uL (ref 4.0–10.5)
nRBC: 0 % (ref 0.0–0.2)

## 2023-03-31 LAB — RESP PANEL BY RT-PCR (RSV, FLU A&B, COVID)  RVPGX2
Influenza A by PCR: NEGATIVE
Influenza B by PCR: NEGATIVE
Resp Syncytial Virus by PCR: NEGATIVE
SARS Coronavirus 2 by RT PCR: NEGATIVE

## 2023-03-31 LAB — ETHANOL: Alcohol, Ethyl (B): <10 mg/dL (ref <10)

## 2023-03-31 MED ORDER — IOHEXOL 300 MG/ML  SOLN
100.0000 mL | Freq: Once | INTRAMUSCULAR | Status: AC | PRN
  Administered 2023-03-31: 100 mL via INTRAVENOUS

## 2023-03-31 MED ORDER — SODIUM CHLORIDE 0.9 % IV BOLUS (SEPSIS)
1000.0000 mL | Freq: Once | INTRAVENOUS | Status: AC
  Administered 2023-03-31: 1000 mL via INTRAVENOUS

## 2023-03-31 MED ORDER — SODIUM CHLORIDE 0.9 % IV BOLUS
1000.0000 mL | Freq: Once | INTRAVENOUS | Status: AC
  Administered 2023-03-31: 1000 mL via INTRAVENOUS

## 2023-03-31 NOTE — ED Notes (Signed)
Introduced self to pt  Pt required assistance walking from VT2 to [employer] Wife stated pt has been dizzy and unstable on his feet for 6 days.  Complains of diarrhea x 6 days

## 2023-03-31 NOTE — ED Notes (Signed)
Pt able to drink water without nausea Pt able to walk from room 23 to EDP office and back without dizziness or difficulty

## 2023-03-31 NOTE — ED Notes (Signed)
Pt off to CT.

## 2023-03-31 NOTE — ED Notes (Signed)
Ortho static vitals assessed Pt swaying when standing IV access established Labs drawn Fluids started Call be on stretcher Waiting for CT

## 2023-03-31 NOTE — ED Notes (Signed)
Pt approx 400cc into 1000cc bolus Pt aware to hit call bell when finished for DC

## 2023-03-31 NOTE — ED Triage Notes (Addendum)
C/o diarrhea and fever since Saturday that is worse after eating with temp max 101.6 Pt states since has developed weakness and dizziness with standing.  Last fever was Thursday.  Denies abd pain  home covid test negative

## 2023-03-31 NOTE — Discharge Instructions (Signed)
Try the brat diet which is bananas rice applesauce and toast.  You may also add yogurt to this.  Over-the-counter probiotics may help with your diarrhea as well.  Drink plenty of fluids for the next several days.  Please follow-up with Dr. Ouida Sills on Monday.  If you have further diarrhea you may collect a sample so that he can order a stool culture.  Please return to the emergency department if you have any new or worsening symptoms such as increasing abdominal pain, vomiting, or fever

## 2023-04-03 NOTE — ED Provider Notes (Signed)
[place] EMERGENCY DEPARTMENT AT [facility] Provider Note   CSN: [id number] Arrival date & time: 03/31/23  1416     History  Chief Complaint  Patient presents with   Diarrhea    Thomas Hart is a 72 y.o. male.   Diarrhea Associated symptoms: fever   Associated symptoms: no abdominal pain, no chills, no headaches, no myalgias and no vomiting        Thomas Hart is a 72 y.o. male who presents to the Emergency Department complaining of diarrhea, fever, some generalized weakness.  Symptoms x one week.  Max temp at home of 101.6.  denies vomiting, flank pain, dysuria.  No fever x one day.  Denies abdominal pain, chest pain and shortness of breath.  Had a negative covid test at home.  No recent abx use, black or bloody stools.  Admits to heavy alcohol use prior to onset of current symptoms.      Home Medications Prior to Admission medications   Medication Sig Start Date End Date Taking? Authorizing Provider  amLODipine (NORVASC) 5 MG tablet Take 5 mg by mouth daily. 08/20/15   [provider]  Ascorbic Acid (VITAMIN C) 500 MG CHEW Chew 1 tablet (500 mg total) by mouth 2 (two) times daily. 09/17/15   Tarry Kos, MD  aspirin EC 325 MG tablet Take 1 tablet (325 mg total) by mouth 2 (two) times daily. Patient not taking: Reported on 06/21/2016 09/17/15   Tarry Kos, MD  carvedilol (COREG) 6.25 MG tablet Take 6.25 mg by mouth 2 (two) times daily. 07/28/15   [provider]  ibuprofen (ADVIL,MOTRIN) 400 MG tablet Take 400 mg by mouth every 6 (six) hours as needed for fever, headache or moderate pain.    [provider]  losartan-hydrochlorothiazide (HYZAAR) 100-25 MG tablet Take 1 tablet by mouth daily. 07/28/15   [provider]  methocarbamol (ROBAXIN) 500 MG tablet Take 1 tablet (500 mg total) by mouth every 8 (eight) hours as needed for muscle spasms. 11/01/20   Rancour, Jeannett Senior, MD  methylPREDNISolone (MEDROL DOSEPAK) 4 MG TBPK tablet As  directed 11/01/20   Rancour, Jeannett Senior, MD  milk thistle 175 MG tablet Take 175 mg by mouth daily.    [provider]  ondansetron (ZOFRAN) 4 MG tablet Take 1-2 tablets (4-8 mg total) by mouth every 8 (eight) hours as needed for nausea or vomiting. Patient not taking: Reported on 06/21/2016 09/17/15   Tarry Kos, MD  oxyCODONE (OXY IR/ROXICODONE) 5 MG immediate release tablet Take 1-3 tablets (5-15 mg total) by mouth every 4 (four) hours as needed. Patient not taking: Reported on 06/21/2016 09/17/15   Tarry Kos, MD  oxyCODONE (OXYCONTIN) 10 mg 12 hr tablet Take 1 tablet (10 mg total) by mouth every 12 (twelve) hours. Patient not taking: Reported on 06/21/2016 09/17/15   Tarry Kos, MD  senna-docusate (SENOKOT S) 8.6-50 MG tablet Take 1 tablet by mouth at bedtime as needed. Patient not taking: Reported on 06/21/2016 09/17/15   Tarry Kos, MD  sertraline (ZOLOFT) 100 MG tablet Take 50 mg by mouth daily. 07/28/15   [provider]      Allergies    Tetanus toxoids    Review of Systems   Review of Systems  Constitutional:  Positive for fever. Negative for appetite change and chills.  Respiratory:  Negative for chest tightness and shortness of breath.   Cardiovascular:  Negative for chest pain.  Gastrointestinal:  Positive  for diarrhea. Negative for abdominal pain, nausea, rectal pain and vomiting.  Genitourinary:  Negative for difficulty urinating, dysuria and flank pain.  Musculoskeletal:  Negative for back pain, myalgias, neck pain and neck stiffness.  Skin:  Negative for rash.  Neurological:  Positive for weakness. Negative for syncope, numbness and headaches.  Psychiatric/Behavioral:  Negative for confusion.     Physical Exam Updated Vital Signs BP (!) 149/77 (BP Location: Left Arm)   Pulse 83   Temp 98.4 F (36.9 C) (Oral)   Resp 16   Wt 74.4 kg   SpO2 99%   BMI 24.94 kg/m  Physical Exam Vitals and nursing note reviewed.  Constitutional:      General: He is  not in acute distress.    Appearance: Normal appearance. He is not ill-appearing or toxic-appearing.  HENT:     Mouth/Throat:     Mouth: Mucous membranes are dry.     Pharynx: Oropharynx is clear.  Eyes:     Extraocular Movements: Extraocular movements intact.     Conjunctiva/sclera: Conjunctivae normal.     Pupils: Pupils are equal, round, and reactive to light.  Cardiovascular:     Rate and Rhythm: Normal rate and regular rhythm.     Pulses: Normal pulses.  Pulmonary:     Effort: Pulmonary effort is normal.  Abdominal:     General: There is no distension.     Palpations: Abdomen is soft.     Tenderness: There is no abdominal tenderness. There is no guarding or rebound.  Musculoskeletal:        General: Normal range of motion.     Cervical back: Normal range of motion. No rigidity.     Right lower leg: No edema.     Left lower leg: No edema.  Skin:    General: Skin is warm.     Capillary Refill: Capillary refill takes less than 2 seconds.  Neurological:     General: No focal deficit present.     Mental Status: He is alert.     Sensory: No sensory deficit.     Motor: No weakness.     ED Results / Procedures / Treatments   Labs (all labs ordered are listed, but only abnormal results are displayed) Labs Reviewed  LIPASE, BLOOD - Abnormal; Notable for the following components:      Result Value   Lipase 69 (*)    All other components within normal limits  COMPREHENSIVE METABOLIC PANEL - Abnormal; Notable for the following components:   Sodium 132 (*)    Chloride 94 (*)    Glucose, Bld 118 (*)    BUN 25 (*)    Creatinine, Ser 1.29 (*)    Calcium 8.4 (*)    AST 84 (*)    ALT 84 (*)    GFR, Estimated 60 (*)    All other components within normal limits  CBC - Abnormal; Notable for the following components:   Platelets 81 (*)    All other components within normal limits  RESP PANEL BY RT-PCR (RSV, FLU A&B, COVID)  RVPGX2  ETHANOL    EKG None  Radiology CT  ABDOMEN PELVIS W CONTRAST  Result Date: 03/31/2023 CLINICAL DATA:  Abdominal pain, diarrhea, fever EXAM: CT ABDOMEN AND PELVIS WITH CONTRAST TECHNIQUE: Multidetector CT imaging of the abdomen and pelvis was performed using the standard protocol following bolus administration of intravenous contrast. RADIATION DOSE REDUCTION: This exam was performed according to the departmental dose-optimization program which includes automated  exposure control, adjustment of the mA and/or kV according to patient size and/or use of iterative reconstruction technique. CONTRAST:  OMNIPAQUE IOHEXOL 300 MG/ML  SOLN COMPARISON:  None Available. FINDINGS: Lower chest: Lung bases are clear. Hepatobiliary: Liver is within normal limits, noting focal fat/altered perfusion along the falciform ligament. Gallbladder is unremarkable. No intrahepatic or extrahepatic ductal dilatation. Pancreas: Within normal limits. Spleen: Within normal limits. Adrenals/Urinary Tract: Adrenal glands are within normal limits. Kidneys within normal limits. No hydronephrosis. Bladder is within normal limits. Stomach/Bowel: Stomach is within normal limits. No evidence of bowel obstruction. Suspected prior appendectomy. No colonic wall thickening or inflammatory changes. Vascular/Lymphatic: No evidence of abdominal aortic aneurysm. Atherosclerotic calcifications of the abdominal aorta and branch vessels, although vessels remain patent. No suspicious abdominopelvic lymphadenopathy. Reproductive: Prostate is unremarkable. Other: No abdominopelvic ascites. Musculoskeletal: Degenerative changes of the visualized thoracolumbar spine. Right hip arthroplasty in satisfactory position. IMPRESSION: Negative CT abdomen/pelvis. No CT findings to account for the patient's abdominal pain. Electronically Signed   By: Charline Bills M.D.   On: 03/31/2023 18:55     Procedures Procedures    Medications Ordered in ED Medications  sodium chloride 0.9 % bolus 1,000 mL  (0 mLs Intravenous Stopped 03/31/23 1826)  iohexol (OMNIPAQUE) 300 MG/ML solution 100 mL (100 mLs Intravenous Contrast Given 03/31/23 1709)  sodium chloride 0.9 % bolus 1,000 mL (0 mLs Intravenous Stopped 03/31/23 2040)    ED Course/ Medical Decision Making/ A&P                                 Medical Decision Making Pt here with reported fever earlier that has since resolved, frequent diiarrhea w/o any recent abx, no bloody or black stools.  Some generalized weakness.  No shortness of breath, chest or abdominal pain. Hx of frequent alcohol intake.  Admits to heavy etoh use prior to diarrhea onset.   Does not appear to be in acute withdrawal  I suspect viral process.  Pt well appearing.  Does have some dry mucous membranes, suspect weakness from dehyration.  AKI, doubt acute abdominal process given lack of abd pain, no abx use to suggest C diff.    Amount and/or Complexity of Data Reviewed Labs: ordered.    Details: Mildly elevated lipase, chemistries elevated creat, no recent available for comparison.  Likely pre renal.  Mildly elevated LFT's Radiology: ordered.    Details: CT abd pelvis w/o acute findings.   Discussion of management or test interpretation with external provider(s): IVF's given here, on recheck reports feeling better. Wanted to collect stool sample but pt unable to provide sample here.  Ambulates in dept with steady gait.  No focal deficit.  Had neg covid test at home.  Possible etoh related gastritis.  Offered treatment for his diarrhea, pt prefers to try BRAT diet and agrees to close PCP f/u next week and recommend stool testing could be arranged by PCP if diarrhea continues.    Risk Prescription drug management.           Final Clinical Impression(s) / ED Diagnoses Final diagnoses:  Diarrhea, unspecified type    Rx / DC Orders ED Discharge Orders     None         Pauline Aus, PA-C 04/03/23 1329    Bethann Berkshire, MD 04/06/23 1139

## 2024-01-08 DIAGNOSIS — F419 Anxiety disorder, unspecified: Secondary | ICD-10-CM | POA: Diagnosis not present

## 2024-01-08 DIAGNOSIS — E785 Hyperlipidemia, unspecified: Secondary | ICD-10-CM | POA: Diagnosis not present

## 2024-01-08 DIAGNOSIS — I10 Essential (primary) hypertension: Secondary | ICD-10-CM | POA: Diagnosis not present

## 2024-01-08 DIAGNOSIS — Z79899 Other long term (current) drug therapy: Secondary | ICD-10-CM | POA: Diagnosis not present

## 2024-01-08 DIAGNOSIS — R7301 Impaired fasting glucose: Secondary | ICD-10-CM | POA: Diagnosis not present

## 2024-01-08 DIAGNOSIS — Z125 Encounter for screening for malignant neoplasm of prostate: Secondary | ICD-10-CM | POA: Diagnosis not present

## 2024-01-15 DIAGNOSIS — Z85820 Personal history of malignant melanoma of skin: Secondary | ICD-10-CM | POA: Diagnosis not present

## 2024-01-15 DIAGNOSIS — I10 Essential (primary) hypertension: Secondary | ICD-10-CM | POA: Diagnosis not present

## 2024-01-15 DIAGNOSIS — E785 Hyperlipidemia, unspecified: Secondary | ICD-10-CM | POA: Diagnosis not present

## 2024-01-15 DIAGNOSIS — Z0001 Encounter for general adult medical examination with abnormal findings: Secondary | ICD-10-CM | POA: Diagnosis not present

## 2024-01-15 DIAGNOSIS — R7309 Other abnormal glucose: Secondary | ICD-10-CM | POA: Diagnosis not present

## 2024-01-15 DIAGNOSIS — F4000 Agoraphobia, unspecified: Secondary | ICD-10-CM | POA: Diagnosis not present

## 2024-03-06 DIAGNOSIS — L570 Actinic keratosis: Secondary | ICD-10-CM | POA: Diagnosis not present

## 2024-03-06 DIAGNOSIS — D225 Melanocytic nevi of trunk: Secondary | ICD-10-CM | POA: Diagnosis not present

## 2024-03-06 DIAGNOSIS — D485 Neoplasm of uncertain behavior of skin: Secondary | ICD-10-CM | POA: Diagnosis not present

## 2024-03-06 DIAGNOSIS — Z1283 Encounter for screening for malignant neoplasm of skin: Secondary | ICD-10-CM | POA: Diagnosis not present

## 2024-03-06 DIAGNOSIS — X32XXXD Exposure to sunlight, subsequent encounter: Secondary | ICD-10-CM | POA: Diagnosis not present

## 2024-06-05 DIAGNOSIS — Z23 Encounter for immunization: Secondary | ICD-10-CM | POA: Diagnosis not present
